# Patient Record
Sex: Female | Born: 1935 | Race: White | Hispanic: No
Health system: Southern US, Community
[De-identification: ages and names within clinical notes are randomized; demographics above are authoritative.]

---

## 2000-08-24 ENCOUNTER — Ambulatory Visit (HOSPITAL_COMMUNITY): Admission: RE | Admit: 2000-08-24 | Discharge: 2000-08-24 | Payer: Self-pay | Admitting: *Deleted

## 2000-08-24 ENCOUNTER — Encounter: Payer: Self-pay | Admitting: *Deleted

## 2001-01-19 ENCOUNTER — Other Ambulatory Visit: Admission: RE | Admit: 2001-01-19 | Discharge: 2001-01-19 | Payer: Self-pay | Admitting: Family Medicine

## 2001-02-07 ENCOUNTER — Encounter: Payer: Self-pay | Admitting: Family Medicine

## 2001-02-07 ENCOUNTER — Ambulatory Visit (HOSPITAL_COMMUNITY): Admission: RE | Admit: 2001-02-07 | Discharge: 2001-02-07 | Payer: Self-pay | Admitting: Family Medicine

## 2003-02-27 ENCOUNTER — Other Ambulatory Visit: Payer: Self-pay

## 2004-08-26 ENCOUNTER — Ambulatory Visit: Payer: Self-pay | Admitting: Family Medicine

## 2004-09-06 ENCOUNTER — Ambulatory Visit: Payer: Self-pay | Admitting: Family Medicine

## 2004-11-15 ENCOUNTER — Ambulatory Visit: Payer: Self-pay

## 2005-06-22 ENCOUNTER — Ambulatory Visit: Payer: Self-pay | Admitting: Family Medicine

## 2005-09-05 ENCOUNTER — Ambulatory Visit: Payer: Self-pay | Admitting: Specialist

## 2005-09-29 ENCOUNTER — Ambulatory Visit: Payer: Self-pay | Admitting: Family Medicine

## 2005-12-08 ENCOUNTER — Ambulatory Visit: Payer: Self-pay | Admitting: Gastroenterology

## 2006-09-07 ENCOUNTER — Ambulatory Visit: Payer: Self-pay | Admitting: Family Medicine

## 2007-09-25 ENCOUNTER — Ambulatory Visit: Payer: Self-pay | Admitting: Family Medicine

## 2008-09-18 ENCOUNTER — Ambulatory Visit: Payer: Self-pay | Admitting: Cardiology

## 2008-09-26 ENCOUNTER — Ambulatory Visit: Payer: Self-pay | Admitting: Cardiology

## 2009-10-12 ENCOUNTER — Ambulatory Visit: Payer: Self-pay | Admitting: Internal Medicine

## 2010-09-21 ENCOUNTER — Ambulatory Visit: Payer: Self-pay | Admitting: Internal Medicine

## 2011-11-30 ENCOUNTER — Ambulatory Visit: Payer: Self-pay | Admitting: Gastroenterology

## 2012-03-05 ENCOUNTER — Other Ambulatory Visit: Payer: Self-pay

## 2012-03-05 LAB — PRO B NATRIURETIC PEPTIDE: B-Type Natriuretic Peptide: 92629 pg/mL — ABNORMAL HIGH (ref 0–125)

## 2012-05-02 ENCOUNTER — Inpatient Hospital Stay: Payer: Self-pay

## 2012-05-02 LAB — BASIC METABOLIC PANEL
Anion Gap: 6 — ABNORMAL LOW (ref 7–16)
BUN: 23 mg/dL — ABNORMAL HIGH (ref 7–18)
Calcium, Total: 7.8 mg/dL — ABNORMAL LOW (ref 8.5–10.1)
Chloride: 106 mmol/L (ref 98–107)
Co2: 31 mmol/L (ref 21–32)
Creatinine: 1.67 mg/dL — ABNORMAL HIGH (ref 0.60–1.30)
EGFR (African American): 35 — ABNORMAL LOW
EGFR (Non-African Amer.): 30 — ABNORMAL LOW
Glucose: 95 mg/dL (ref 65–99)
Osmolality: 288 (ref 275–301)
Potassium: 4.8 mmol/L (ref 3.5–5.1)
Sodium: 143 mmol/L (ref 136–145)

## 2012-05-02 LAB — CBC
HCT: 34.6 % — ABNORMAL LOW (ref 35.0–47.0)
HGB: 11.3 g/dL — ABNORMAL LOW (ref 12.0–16.0)
MCH: 33 pg (ref 26.0–34.0)
MCHC: 32.6 g/dL (ref 32.0–36.0)
MCV: 101 fL — ABNORMAL HIGH (ref 80–100)
Platelet: 301 10*3/uL (ref 150–440)
RBC: 3.41 10*6/uL — ABNORMAL LOW (ref 3.80–5.20)
RDW: 16 % — ABNORMAL HIGH (ref 11.5–14.5)
WBC: 11.1 10*3/uL — ABNORMAL HIGH (ref 3.6–11.0)

## 2012-05-02 LAB — PRO B NATRIURETIC PEPTIDE: B-Type Natriuretic Peptide: 45431 pg/mL — ABNORMAL HIGH (ref 0–125)

## 2012-05-02 LAB — CK TOTAL AND CKMB (NOT AT ARMC)
CK, Total: 78 U/L (ref 21–215)
CK-MB: 2 ng/mL (ref 0.5–3.6)

## 2012-05-02 LAB — TROPONIN I: Troponin-I: 0.04 ng/mL

## 2012-05-03 LAB — BASIC METABOLIC PANEL
Anion Gap: 8 (ref 7–16)
BUN: 26 mg/dL — ABNORMAL HIGH (ref 7–18)
Calcium, Total: 7.8 mg/dL — ABNORMAL LOW (ref 8.5–10.1)
Chloride: 107 mmol/L (ref 98–107)
Co2: 29 mmol/L (ref 21–32)
Creatinine: 1.65 mg/dL — ABNORMAL HIGH (ref 0.60–1.30)
EGFR (African American): 35 — ABNORMAL LOW
EGFR (Non-African Amer.): 30 — ABNORMAL LOW
Glucose: 127 mg/dL — ABNORMAL HIGH (ref 65–99)
Osmolality: 293 (ref 275–301)
Potassium: 4.7 mmol/L (ref 3.5–5.1)
Sodium: 144 mmol/L (ref 136–145)

## 2012-05-03 LAB — URINALYSIS, COMPLETE
Glucose,UR: NEGATIVE mg/dL (ref 0–75)
Hyaline Cast: 38
Ketone: NEGATIVE
Nitrite: NEGATIVE
Ph: 5 (ref 4.5–8.0)
Protein: 30
RBC,UR: 25 /HPF (ref 0–5)
Specific Gravity: 1.011 (ref 1.003–1.030)
Squamous Epithelial: 1

## 2012-05-03 LAB — CBC WITH DIFFERENTIAL/PLATELET
Basophil #: 0 10*3/uL (ref 0.0–0.1)
Basophil %: 0.4 %
Eosinophil #: 0 10*3/uL (ref 0.0–0.7)
Eosinophil %: 0.1 %
HCT: 33.5 % — ABNORMAL LOW (ref 35.0–47.0)
HGB: 11 g/dL — ABNORMAL LOW (ref 12.0–16.0)
Lymphocyte #: 0.3 10*3/uL — ABNORMAL LOW (ref 1.0–3.6)
Lymphocyte %: 3.3 %
MCH: 33.4 pg (ref 26.0–34.0)
MCHC: 33 g/dL (ref 32.0–36.0)
MCV: 101 fL — ABNORMAL HIGH (ref 80–100)
Monocyte #: 0.1 x10 3/mm — ABNORMAL LOW (ref 0.2–0.9)
Monocyte %: 0.5 %
Neutrophil #: 9.4 10*3/uL — ABNORMAL HIGH (ref 1.4–6.5)
Neutrophil %: 95.7 %
Platelet: 261 10*3/uL (ref 150–440)
RBC: 3.3 10*6/uL — ABNORMAL LOW (ref 3.80–5.20)
RDW: 15.6 % — ABNORMAL HIGH (ref 11.5–14.5)
WBC: 9.8 10*3/uL (ref 3.6–11.0)

## 2012-05-03 LAB — CK TOTAL AND CKMB (NOT AT ARMC)
CK, Total: 57 U/L (ref 21–215)
CK, Total: 46 U/L (ref 21–215)
CK-MB: 1.8 ng/mL (ref 0.5–3.6)
CK-MB: 1.9 ng/mL (ref 0.5–3.6)

## 2012-05-03 LAB — TROPONIN I
Troponin-I: 0.04 ng/mL
Troponin-I: 0.03 ng/mL

## 2012-05-04 LAB — BASIC METABOLIC PANEL WITH GFR
Anion Gap: 5 — ABNORMAL LOW
BUN: 35 mg/dL — ABNORMAL HIGH
Calcium, Total: 7.9 mg/dL — ABNORMAL LOW
Chloride: 105 mmol/L
Co2: 29 mmol/L
Creatinine: 1.9 mg/dL — ABNORMAL HIGH
EGFR (African American): 30 — ABNORMAL LOW
EGFR (Non-African Amer.): 26 — ABNORMAL LOW
Glucose: 95 mg/dL
Osmolality: 285
Potassium: 4.7 mmol/L
Sodium: 139 mmol/L

## 2012-05-05 LAB — BASIC METABOLIC PANEL
Anion Gap: 7 (ref 7–16)
BUN: 40 mg/dL — ABNORMAL HIGH (ref 7–18)
Chloride: 105 mmol/L (ref 98–107)
Co2: 28 mmol/L (ref 21–32)
Creatinine: 1.72 mg/dL — ABNORMAL HIGH (ref 0.60–1.30)
EGFR (African American): 33 — ABNORMAL LOW
Glucose: 108 mg/dL — ABNORMAL HIGH (ref 65–99)
Osmolality: 290 (ref 275–301)
Potassium: 5.1 mmol/L (ref 3.5–5.1)

## 2012-05-05 LAB — URINE CULTURE

## 2012-05-06 LAB — BASIC METABOLIC PANEL
BUN: 52 mg/dL — ABNORMAL HIGH (ref 7–18)
Calcium, Total: 7.5 mg/dL — ABNORMAL LOW (ref 8.5–10.1)
Chloride: 103 mmol/L (ref 98–107)
Co2: 29 mmol/L (ref 21–32)
EGFR (African American): 28 — ABNORMAL LOW
EGFR (Non-African Amer.): 25 — ABNORMAL LOW
Osmolality: 290 (ref 275–301)
Potassium: 4.8 mmol/L (ref 3.5–5.1)

## 2012-05-06 LAB — CBC WITH DIFFERENTIAL/PLATELET
Basophil #: 0 10*3/uL (ref 0.0–0.1)
Basophil %: 0.2 %
Eosinophil #: 0 10*3/uL (ref 0.0–0.7)
HGB: 11.2 g/dL — ABNORMAL LOW (ref 12.0–16.0)
Lymphocyte #: 0.2 10*3/uL — ABNORMAL LOW (ref 1.0–3.6)
Lymphocyte %: 2.9 %
MCHC: 32.5 g/dL (ref 32.0–36.0)
Monocyte %: 1.3 %
Neutrophil %: 95.6 %
Platelet: 377 10*3/uL (ref 150–440)
RBC: 3.42 10*6/uL — ABNORMAL LOW (ref 3.80–5.20)
RDW: 15.4 % — ABNORMAL HIGH (ref 11.5–14.5)
WBC: 8.3 10*3/uL (ref 3.6–11.0)

## 2012-05-07 LAB — BASIC METABOLIC PANEL
BUN: 62 mg/dL — ABNORMAL HIGH (ref 7–18)
Co2: 27 mmol/L (ref 21–32)
EGFR (African American): 27 — ABNORMAL LOW
EGFR (Non-African Amer.): 23 — ABNORMAL LOW
Glucose: 190 mg/dL — ABNORMAL HIGH (ref 65–99)
Osmolality: 293 (ref 275–301)
Sodium: 135 mmol/L — ABNORMAL LOW (ref 136–145)

## 2012-05-08 LAB — BASIC METABOLIC PANEL
BUN: 57 mg/dL — ABNORMAL HIGH (ref 7–18)
Creatinine: 1.87 mg/dL — ABNORMAL HIGH (ref 0.60–1.30)
EGFR (Non-African Amer.): 26 — ABNORMAL LOW
Glucose: 134 mg/dL — ABNORMAL HIGH (ref 65–99)
Osmolality: 293 (ref 275–301)
Potassium: 4.9 mmol/L (ref 3.5–5.1)
Sodium: 138 mmol/L (ref 136–145)

## 2012-05-09 LAB — BASIC METABOLIC PANEL
Anion Gap: 6 — ABNORMAL LOW (ref 7–16)
BUN: 57 mg/dL — ABNORMAL HIGH (ref 7–18)
Calcium, Total: 7.7 mg/dL — ABNORMAL LOW (ref 8.5–10.1)
Chloride: 102 mmol/L (ref 98–107)
Co2: 29 mmol/L (ref 21–32)
Creatinine: 1.68 mg/dL — ABNORMAL HIGH (ref 0.60–1.30)
EGFR (African American): 34 — ABNORMAL LOW
EGFR (Non-African Amer.): 30 — ABNORMAL LOW
Glucose: 134 mg/dL — ABNORMAL HIGH (ref 65–99)

## 2012-05-10 LAB — BASIC METABOLIC PANEL
Anion Gap: 7 (ref 7–16)
BUN: 56 mg/dL — ABNORMAL HIGH (ref 7–18)
Calcium, Total: 7.6 mg/dL — ABNORMAL LOW (ref 8.5–10.1)
Chloride: 102 mmol/L (ref 98–107)
Co2: 29 mmol/L (ref 21–32)
EGFR (African American): 32 — ABNORMAL LOW
EGFR (Non-African Amer.): 28 — ABNORMAL LOW
Osmolality: 294 (ref 275–301)
Potassium: 4.4 mmol/L (ref 3.5–5.1)

## 2012-05-10 LAB — PRO B NATRIURETIC PEPTIDE: B-Type Natriuretic Peptide: 64070 pg/mL — ABNORMAL HIGH (ref 0–125)

## 2012-05-11 LAB — BASIC METABOLIC PANEL
Calcium, Total: 7.6 mg/dL — ABNORMAL LOW (ref 8.5–10.1)
Chloride: 102 mmol/L (ref 98–107)
Co2: 31 mmol/L (ref 21–32)
Creatinine: 1.63 mg/dL — ABNORMAL HIGH (ref 0.60–1.30)
EGFR (African American): 36 — ABNORMAL LOW
EGFR (Non-African Amer.): 31 — ABNORMAL LOW
Glucose: 133 mg/dL — ABNORMAL HIGH (ref 65–99)
Osmolality: 294 (ref 275–301)
Potassium: 4.5 mmol/L (ref 3.5–5.1)
Sodium: 139 mmol/L (ref 136–145)

## 2012-05-11 LAB — PLATELET COUNT: Platelet: 345 10*3/uL (ref 150–440)

## 2012-05-12 LAB — CBC WITH DIFFERENTIAL/PLATELET
Basophil #: 0 10*3/uL (ref 0.0–0.1)
Basophil %: 0.2 %
Eosinophil #: 0 10*3/uL (ref 0.0–0.7)
Eosinophil %: 0 %
HCT: 35.7 % (ref 35.0–47.0)
Lymphocyte #: 0.1 10*3/uL — ABNORMAL LOW (ref 1.0–3.6)
Lymphocyte %: 1.1 %
MCH: 33.6 pg (ref 26.0–34.0)
MCHC: 33.7 g/dL (ref 32.0–36.0)
MCV: 100 fL (ref 80–100)
Neutrophil %: 97.6 %
RBC: 3.58 10*6/uL — ABNORMAL LOW (ref 3.80–5.20)

## 2012-05-12 LAB — BASIC METABOLIC PANEL
Anion Gap: 7 (ref 7–16)
BUN: 48 mg/dL — ABNORMAL HIGH (ref 7–18)
Chloride: 101 mmol/L (ref 98–107)
Creatinine: 1.59 mg/dL — ABNORMAL HIGH (ref 0.60–1.30)
EGFR (African American): 37 — ABNORMAL LOW
Osmolality: 294 (ref 275–301)
Potassium: 4.9 mmol/L (ref 3.5–5.1)
Sodium: 139 mmol/L (ref 136–145)

## 2012-05-13 LAB — BASIC METABOLIC PANEL
Calcium, Total: 7.7 mg/dL — ABNORMAL LOW (ref 8.5–10.1)
Chloride: 103 mmol/L (ref 98–107)
Co2: 31 mmol/L (ref 21–32)
Creatinine: 1.4 mg/dL — ABNORMAL HIGH (ref 0.60–1.30)
EGFR (African American): 43 — ABNORMAL LOW
Glucose: 96 mg/dL (ref 65–99)
Osmolality: 290 (ref 275–301)
Potassium: 4.5 mmol/L (ref 3.5–5.1)
Sodium: 140 mmol/L (ref 136–145)

## 2012-05-14 LAB — BASIC METABOLIC PANEL
Anion Gap: 4 — ABNORMAL LOW (ref 7–16)
Calcium, Total: 7.8 mg/dL — ABNORMAL LOW (ref 8.5–10.1)
Co2: 33 mmol/L — ABNORMAL HIGH (ref 21–32)
Creatinine: 1.35 mg/dL — ABNORMAL HIGH (ref 0.60–1.30)
EGFR (African American): 45 — ABNORMAL LOW
EGFR (Non-African Amer.): 39 — ABNORMAL LOW
Glucose: 98 mg/dL (ref 65–99)
Osmolality: 285 (ref 275–301)
Potassium: 4.5 mmol/L (ref 3.5–5.1)

## 2012-05-15 LAB — BASIC METABOLIC PANEL
Anion Gap: 4 — ABNORMAL LOW (ref 7–16)
Calcium, Total: 7.8 mg/dL — ABNORMAL LOW (ref 8.5–10.1)
Co2: 32 mmol/L (ref 21–32)
EGFR (African American): 43 — ABNORMAL LOW
EGFR (Non-African Amer.): 37 — ABNORMAL LOW
Glucose: 127 mg/dL — ABNORMAL HIGH (ref 65–99)
Sodium: 138 mmol/L (ref 136–145)

## 2012-05-16 ENCOUNTER — Encounter: Payer: Self-pay | Admitting: Internal Medicine

## 2012-05-22 LAB — BASIC METABOLIC PANEL
Creatinine: 1.36 mg/dL — ABNORMAL HIGH (ref 0.60–1.30)
EGFR (African American): 44 — ABNORMAL LOW
Glucose: 78 mg/dL (ref 65–99)
Osmolality: 281 (ref 275–301)

## 2012-05-26 ENCOUNTER — Encounter: Payer: Self-pay | Admitting: Internal Medicine

## 2012-08-10 ENCOUNTER — Inpatient Hospital Stay: Payer: Self-pay

## 2012-08-10 LAB — CBC
HGB: 11.7 g/dL — ABNORMAL LOW (ref 12.0–16.0)
MCHC: 32.4 g/dL (ref 32.0–36.0)
RBC: 3.63 10*6/uL — ABNORMAL LOW (ref 3.80–5.20)
RDW: 14.3 % (ref 11.5–14.5)
WBC: 8.5 10*3/uL (ref 3.6–11.0)

## 2012-08-10 LAB — BASIC METABOLIC PANEL
BUN: 48 mg/dL — ABNORMAL HIGH (ref 7–18)
Calcium, Total: 8.5 mg/dL (ref 8.5–10.1)
Chloride: 100 mmol/L (ref 98–107)
Co2: 27 mmol/L (ref 21–32)
EGFR (Non-African Amer.): 17 — ABNORMAL LOW
Glucose: 122 mg/dL — ABNORMAL HIGH (ref 65–99)
Osmolality: 286 (ref 275–301)
Potassium: 5.1 mmol/L (ref 3.5–5.1)

## 2012-08-10 LAB — CK TOTAL AND CKMB (NOT AT ARMC): CK-MB: 1.9 ng/mL (ref 0.5–3.6)

## 2012-08-10 LAB — PRO B NATRIURETIC PEPTIDE: B-Type Natriuretic Peptide: 63358 pg/mL — ABNORMAL HIGH (ref 0–125)

## 2012-08-11 LAB — BASIC METABOLIC PANEL
BUN: 50 mg/dL — ABNORMAL HIGH (ref 7–18)
Calcium, Total: 8.5 mg/dL (ref 8.5–10.1)
Co2: 27 mmol/L (ref 21–32)
Creatinine: 2.79 mg/dL — ABNORMAL HIGH (ref 0.60–1.30)
EGFR (African American): 19 — ABNORMAL LOW
Osmolality: 289 (ref 275–301)
Potassium: 5 mmol/L (ref 3.5–5.1)
Sodium: 137 mmol/L (ref 136–145)

## 2012-08-11 LAB — CBC WITH DIFFERENTIAL/PLATELET
Basophil #: 0.1 10*3/uL (ref 0.0–0.1)
Basophil %: 0.6 %
Lymphocyte #: 1.2 10*3/uL (ref 1.0–3.6)
MCH: 32.5 pg (ref 26.0–34.0)
MCHC: 33.1 g/dL (ref 32.0–36.0)
Monocyte %: 10.5 %
Neutrophil #: 6.2 10*3/uL (ref 1.4–6.5)
RDW: 14.2 % (ref 11.5–14.5)
WBC: 8.3 10*3/uL (ref 3.6–11.0)

## 2012-08-11 LAB — CK TOTAL AND CKMB (NOT AT ARMC)
CK, Total: 60 U/L (ref 21–215)
CK-MB: 2.2 ng/mL (ref 0.5–3.6)
CK-MB: 2.1 ng/mL (ref 0.5–3.6)

## 2012-08-11 LAB — TROPONIN I: Troponin-I: 0.1 ng/mL — ABNORMAL HIGH

## 2012-08-12 LAB — BASIC METABOLIC PANEL
BUN: 59 mg/dL — ABNORMAL HIGH (ref 7–18)
Chloride: 101 mmol/L (ref 98–107)
Co2: 30 mmol/L (ref 21–32)
EGFR (African American): 24 — ABNORMAL LOW
EGFR (Non-African Amer.): 21 — ABNORMAL LOW
Osmolality: 291 (ref 275–301)
Sodium: 136 mmol/L (ref 136–145)

## 2012-08-12 LAB — CBC WITH DIFFERENTIAL/PLATELET
Basophil #: 0 10*3/uL (ref 0.0–0.1)
Basophil %: 0.1 %
Eosinophil #: 0 10*3/uL (ref 0.0–0.7)
HCT: 30.6 % — ABNORMAL LOW (ref 35.0–47.0)
HGB: 10 g/dL — ABNORMAL LOW (ref 12.0–16.0)
Lymphocyte %: 4.5 %
MCHC: 32.8 g/dL (ref 32.0–36.0)
Monocyte #: 0.1 x10 3/mm — ABNORMAL LOW (ref 0.2–0.9)
Monocyte %: 1.5 %
Neutrophil #: 5.6 10*3/uL (ref 1.4–6.5)
Neutrophil %: 93.9 %
RBC: 3.11 10*6/uL — ABNORMAL LOW (ref 3.80–5.20)
RDW: 13.7 % (ref 11.5–14.5)
WBC: 6 10*3/uL (ref 3.6–11.0)

## 2012-08-13 LAB — BASIC METABOLIC PANEL
Anion Gap: 7 (ref 7–16)
Calcium, Total: 8.1 mg/dL — ABNORMAL LOW (ref 8.5–10.1)
Chloride: 100 mmol/L (ref 98–107)
Co2: 26 mmol/L (ref 21–32)
EGFR (African American): 21 — ABNORMAL LOW
EGFR (Non-African Amer.): 18 — ABNORMAL LOW
Glucose: 130 mg/dL — ABNORMAL HIGH (ref 65–99)
Glucose: 124 mg/dL — ABNORMAL HIGH (ref 65–99)
Osmolality: 284 (ref 275–301)
Osmolality: 292 (ref 275–301)
Potassium: 5.5 mmol/L — ABNORMAL HIGH (ref 3.5–5.1)
Potassium: 4.5 mmol/L (ref 3.5–5.1)
Sodium: 135 mmol/L — ABNORMAL LOW (ref 136–145)

## 2012-08-13 LAB — CBC WITH DIFFERENTIAL/PLATELET
Basophil %: 0.1 %
Eosinophil #: 0 10*3/uL (ref 0.0–0.7)
HGB: 11.2 g/dL — ABNORMAL LOW (ref 12.0–16.0)
Lymphocyte #: 0.3 10*3/uL — ABNORMAL LOW (ref 1.0–3.6)
MCH: 33 pg (ref 26.0–34.0)
MCHC: 33.6 g/dL (ref 32.0–36.0)
MCV: 98 fL (ref 80–100)
Monocyte #: 0.1 x10 3/mm — ABNORMAL LOW (ref 0.2–0.9)
Neutrophil #: 6 10*3/uL (ref 1.4–6.5)
Platelet: 152 10*3/uL (ref 150–440)
RBC: 3.38 10*6/uL — ABNORMAL LOW (ref 3.80–5.20)
WBC: 6.5 10*3/uL (ref 3.6–11.0)

## 2012-08-14 LAB — BASIC METABOLIC PANEL
Anion Gap: 7 (ref 7–16)
BUN: 77 mg/dL — ABNORMAL HIGH (ref 7–18)
Calcium, Total: 7.9 mg/dL — ABNORMAL LOW (ref 8.5–10.1)
Chloride: 101 mmol/L (ref 98–107)
EGFR (African American): 20 — ABNORMAL LOW
Osmolality: 298 (ref 275–301)
Potassium: 5 mmol/L (ref 3.5–5.1)
Sodium: 136 mmol/L (ref 136–145)

## 2012-08-15 LAB — BASIC METABOLIC PANEL
Anion Gap: 5 — ABNORMAL LOW (ref 7–16)
BUN: 82 mg/dL — ABNORMAL HIGH (ref 7–18)
Calcium, Total: 8 mg/dL — ABNORMAL LOW (ref 8.5–10.1)
Chloride: 102 mmol/L (ref 98–107)
Co2: 31 mmol/L (ref 21–32)
EGFR (African American): 24 — ABNORMAL LOW
Glucose: 114 mg/dL — ABNORMAL HIGH (ref 65–99)
Sodium: 138 mmol/L (ref 136–145)

## 2012-08-16 LAB — BASIC METABOLIC PANEL
Anion Gap: 5 — ABNORMAL LOW (ref 7–16)
BUN: 77 mg/dL — ABNORMAL HIGH (ref 7–18)
Chloride: 104 mmol/L (ref 98–107)
Co2: 31 mmol/L (ref 21–32)
Creatinine: 1.86 mg/dL — ABNORMAL HIGH (ref 0.60–1.30)
EGFR (African American): 30 — ABNORMAL LOW
EGFR (Non-African Amer.): 26 — ABNORMAL LOW
Glucose: 107 mg/dL — ABNORMAL HIGH (ref 65–99)
Potassium: 4.3 mmol/L (ref 3.5–5.1)

## 2012-08-16 LAB — CBC WITH DIFFERENTIAL/PLATELET
Basophil #: 0 10*3/uL (ref 0.0–0.1)
Eosinophil #: 0 10*3/uL (ref 0.0–0.7)
HCT: 36.2 % (ref 35.0–47.0)
HGB: 11.9 g/dL — ABNORMAL LOW (ref 12.0–16.0)
Lymphocyte #: 0.5 10*3/uL — ABNORMAL LOW (ref 1.0–3.6)
MCH: 32.2 pg (ref 26.0–34.0)
MCHC: 33 g/dL (ref 32.0–36.0)
Monocyte #: 0.4 x10 3/mm (ref 0.2–0.9)
Monocyte %: 5.1 %
Neutrophil #: 6.7 10*3/uL — ABNORMAL HIGH (ref 1.4–6.5)
Platelet: 189 10*3/uL (ref 150–440)
RBC: 3.71 10*6/uL — ABNORMAL LOW (ref 3.80–5.20)

## 2012-08-16 LAB — CULTURE, BLOOD (SINGLE)

## 2012-08-17 LAB — BASIC METABOLIC PANEL
Anion Gap: 6 — ABNORMAL LOW (ref 7–16)
BUN: 69 mg/dL — ABNORMAL HIGH (ref 7–18)
Calcium, Total: 8.3 mg/dL — ABNORMAL LOW (ref 8.5–10.1)
Co2: 33 mmol/L — ABNORMAL HIGH (ref 21–32)
EGFR (African American): 32 — ABNORMAL LOW
Osmolality: 301 (ref 275–301)
Potassium: 4.1 mmol/L (ref 3.5–5.1)
Sodium: 141 mmol/L (ref 136–145)

## 2012-08-18 LAB — BASIC METABOLIC PANEL
BUN: 62 mg/dL — ABNORMAL HIGH (ref 7–18)
Calcium, Total: 8.3 mg/dL — ABNORMAL LOW (ref 8.5–10.1)
Chloride: 102 mmol/L (ref 98–107)
Co2: 34 mmol/L — ABNORMAL HIGH (ref 21–32)
Creatinine: 1.78 mg/dL — ABNORMAL HIGH (ref 0.60–1.30)
EGFR (African American): 32 — ABNORMAL LOW
EGFR (Non-African Amer.): 28 — ABNORMAL LOW
Glucose: 83 mg/dL (ref 65–99)
Osmolality: 296 (ref 275–301)
Potassium: 4.1 mmol/L (ref 3.5–5.1)

## 2012-08-19 LAB — BASIC METABOLIC PANEL
Calcium, Total: 8.4 mg/dL — ABNORMAL LOW (ref 8.5–10.1)
Co2: 35 mmol/L — ABNORMAL HIGH (ref 21–32)
EGFR (African American): 33 — ABNORMAL LOW
Glucose: 97 mg/dL (ref 65–99)

## 2012-08-29 ENCOUNTER — Emergency Department: Payer: Self-pay | Admitting: Unknown Physician Specialty

## 2012-09-15 LAB — CULTURE, BLOOD (SINGLE)

## 2013-06-05 ENCOUNTER — Inpatient Hospital Stay: Payer: Self-pay

## 2013-06-05 LAB — CBC WITH DIFFERENTIAL/PLATELET
Basophil #: 0.1 10*3/uL (ref 0.0–0.1)
Basophil %: 1.4 %
EOS ABS: 0.3 10*3/uL (ref 0.0–0.7)
Eosinophil %: 7 %
HCT: 38.5 % (ref 35.0–47.0)
HGB: 12.3 g/dL (ref 12.0–16.0)
LYMPHS ABS: 1.1 10*3/uL (ref 1.0–3.6)
LYMPHS PCT: 23.5 %
MCH: 30.7 pg (ref 26.0–34.0)
MCHC: 32 g/dL (ref 32.0–36.0)
MCV: 96 fL (ref 80–100)
MONOS PCT: 7.9 %
Monocyte #: 0.4 x10 3/mm (ref 0.2–0.9)
Neutrophil #: 2.8 10*3/uL (ref 1.4–6.5)
Neutrophil %: 60.2 %
PLATELETS: 154 10*3/uL (ref 150–440)
RBC: 4.02 10*6/uL (ref 3.80–5.20)
RDW: 15.6 % — AB (ref 11.5–14.5)
WBC: 4.7 10*3/uL (ref 3.6–11.0)

## 2013-06-05 LAB — TROPONIN I
Troponin-I: 0.06 ng/mL — ABNORMAL HIGH
Troponin-I: 0.06 ng/mL — ABNORMAL HIGH
Troponin-I: 0.06 ng/mL — ABNORMAL HIGH

## 2013-06-05 LAB — COMPREHENSIVE METABOLIC PANEL
ALBUMIN: 3.3 g/dL — AB (ref 3.4–5.0)
Alkaline Phosphatase: 84 U/L
Anion Gap: 5 — ABNORMAL LOW (ref 7–16)
BUN: 27 mg/dL — AB (ref 7–18)
Bilirubin,Total: 0.4 mg/dL (ref 0.2–1.0)
CO2: 31 mmol/L (ref 21–32)
Calcium, Total: 9.1 mg/dL (ref 8.5–10.1)
Chloride: 103 mmol/L (ref 98–107)
Creatinine: 1.5 mg/dL — ABNORMAL HIGH (ref 0.60–1.30)
EGFR (Non-African Amer.): 34 — ABNORMAL LOW
GFR CALC AF AMER: 39 — AB
GLUCOSE: 92 mg/dL (ref 65–99)
Osmolality: 282 (ref 275–301)
Potassium: 4.2 mmol/L (ref 3.5–5.1)
SGOT(AST): 23 U/L (ref 15–37)
SGPT (ALT): 25 U/L (ref 12–78)
SODIUM: 139 mmol/L (ref 136–145)
TOTAL PROTEIN: 7.4 g/dL (ref 6.4–8.2)

## 2013-06-05 LAB — CK-MB
CK-MB: 4 ng/mL — ABNORMAL HIGH (ref 0.5–3.6)
CK-MB: 3.1 ng/mL (ref 0.5–3.6)

## 2013-06-05 LAB — CK TOTAL AND CKMB (NOT AT ARMC)
CK, Total: 119 U/L
CK-MB: 3.8 ng/mL — ABNORMAL HIGH (ref 0.5–3.6)

## 2013-06-05 LAB — PRO B NATRIURETIC PEPTIDE: B-Type Natriuretic Peptide: 14103 pg/mL — ABNORMAL HIGH (ref 0–450)

## 2013-06-05 LAB — CK
CK, TOTAL: 109 U/L
CK, Total: 104 U/L

## 2013-06-05 LAB — TSH: Thyroid Stimulating Horm: 3.92 u[IU]/mL

## 2013-06-06 LAB — LIPID PANEL
Cholesterol: 82 mg/dL (ref 0–200)
HDL: 42 mg/dL (ref 40–60)
Ldl Cholesterol, Calc: 28 mg/dL (ref 0–100)
Triglycerides: 61 mg/dL (ref 0–200)
VLDL Cholesterol, Calc: 12 mg/dL (ref 5–40)

## 2013-06-06 LAB — CBC WITH DIFFERENTIAL/PLATELET
BASOS PCT: 1.8 %
Basophil #: 0.1 10*3/uL (ref 0.0–0.1)
EOS PCT: 6.8 %
Eosinophil #: 0.4 10*3/uL (ref 0.0–0.7)
HCT: 35.4 % (ref 35.0–47.0)
HGB: 11.8 g/dL — AB (ref 12.0–16.0)
LYMPHS ABS: 1.2 10*3/uL (ref 1.0–3.6)
Lymphocyte %: 22.6 %
MCH: 31.8 pg (ref 26.0–34.0)
MCHC: 33.4 g/dL (ref 32.0–36.0)
MCV: 95 fL (ref 80–100)
MONO ABS: 0.4 x10 3/mm (ref 0.2–0.9)
Monocyte %: 8.5 %
NEUTROS ABS: 3.1 10*3/uL (ref 1.4–6.5)
Neutrophil %: 60.3 %
PLATELETS: 146 10*3/uL — AB (ref 150–440)
RBC: 3.72 10*6/uL — ABNORMAL LOW (ref 3.80–5.20)
RDW: 15.6 % — AB (ref 11.5–14.5)
WBC: 5.2 10*3/uL (ref 3.6–11.0)

## 2013-06-06 LAB — BASIC METABOLIC PANEL
ANION GAP: 4 — AB (ref 7–16)
BUN: 30 mg/dL — ABNORMAL HIGH (ref 7–18)
CHLORIDE: 101 mmol/L (ref 98–107)
Calcium, Total: 8.7 mg/dL (ref 8.5–10.1)
Co2: 33 mmol/L — ABNORMAL HIGH (ref 21–32)
Creatinine: 1.66 mg/dL — ABNORMAL HIGH (ref 0.60–1.30)
EGFR (African American): 35 — ABNORMAL LOW
GFR CALC NON AF AMER: 30 — AB
Glucose: 88 mg/dL (ref 65–99)
Osmolality: 281 (ref 275–301)
Potassium: 4.2 mmol/L (ref 3.5–5.1)
Sodium: 138 mmol/L (ref 136–145)

## 2013-06-07 LAB — BASIC METABOLIC PANEL
Anion Gap: 4 — ABNORMAL LOW (ref 7–16)
BUN: 44 mg/dL — ABNORMAL HIGH (ref 7–18)
CO2: 30 mmol/L (ref 21–32)
Calcium, Total: 8.4 mg/dL — ABNORMAL LOW (ref 8.5–10.1)
Chloride: 98 mmol/L (ref 98–107)
Creatinine: 1.99 mg/dL — ABNORMAL HIGH (ref 0.60–1.30)
EGFR (Non-African Amer.): 24 — ABNORMAL LOW
GFR CALC AF AMER: 28 — AB
GLUCOSE: 189 mg/dL — AB (ref 65–99)
Osmolality: 281 (ref 275–301)
Potassium: 4.3 mmol/L (ref 3.5–5.1)
Sodium: 132 mmol/L — ABNORMAL LOW (ref 136–145)

## 2013-06-08 LAB — CBC WITH DIFFERENTIAL/PLATELET
Basophil #: 0 10*3/uL (ref 0.0–0.1)
Basophil %: 0.1 %
EOS PCT: 0 %
Eosinophil #: 0 10*3/uL (ref 0.0–0.7)
HCT: 33.2 % — ABNORMAL LOW (ref 35.0–47.0)
HGB: 10.8 g/dL — ABNORMAL LOW (ref 12.0–16.0)
Lymphocyte #: 0.5 10*3/uL — ABNORMAL LOW (ref 1.0–3.6)
Lymphocyte %: 7.6 %
MCH: 30.6 pg (ref 26.0–34.0)
MCHC: 32.5 g/dL (ref 32.0–36.0)
MCV: 94 fL (ref 80–100)
Monocyte #: 0.4 x10 3/mm (ref 0.2–0.9)
Monocyte %: 6.5 %
NEUTROS ABS: 5.8 10*3/uL (ref 1.4–6.5)
NEUTROS PCT: 85.8 %
Platelet: 159 10*3/uL (ref 150–440)
RBC: 3.53 10*6/uL — AB (ref 3.80–5.20)
RDW: 15.5 % — ABNORMAL HIGH (ref 11.5–14.5)
WBC: 6.8 10*3/uL (ref 3.6–11.0)

## 2013-06-08 LAB — BASIC METABOLIC PANEL
Anion Gap: 5 — ABNORMAL LOW (ref 7–16)
BUN: 62 mg/dL — ABNORMAL HIGH (ref 7–18)
CALCIUM: 8.5 mg/dL (ref 8.5–10.1)
Chloride: 95 mmol/L — ABNORMAL LOW (ref 98–107)
Co2: 31 mmol/L (ref 21–32)
Creatinine: 2.18 mg/dL — ABNORMAL HIGH (ref 0.60–1.30)
EGFR (African American): 25 — ABNORMAL LOW
EGFR (Non-African Amer.): 21 — ABNORMAL LOW
Glucose: 132 mg/dL — ABNORMAL HIGH (ref 65–99)
Osmolality: 282 (ref 275–301)
POTASSIUM: 4.2 mmol/L (ref 3.5–5.1)
Sodium: 131 mmol/L — ABNORMAL LOW (ref 136–145)

## 2013-06-09 LAB — BASIC METABOLIC PANEL
Anion Gap: 6 — ABNORMAL LOW (ref 7–16)
BUN: 70 mg/dL — ABNORMAL HIGH (ref 7–18)
CHLORIDE: 96 mmol/L — AB (ref 98–107)
CO2: 32 mmol/L (ref 21–32)
Calcium, Total: 7.9 mg/dL — ABNORMAL LOW (ref 8.5–10.1)
Creatinine: 2.14 mg/dL — ABNORMAL HIGH (ref 0.60–1.30)
EGFR (African American): 25 — ABNORMAL LOW
GFR CALC NON AF AMER: 22 — AB
Glucose: 101 mg/dL — ABNORMAL HIGH (ref 65–99)
Osmolality: 289 (ref 275–301)
Potassium: 4.4 mmol/L (ref 3.5–5.1)
SODIUM: 134 mmol/L — AB (ref 136–145)

## 2013-06-10 LAB — BASIC METABOLIC PANEL
ANION GAP: 5 — AB (ref 7–16)
BUN: 71 mg/dL — AB (ref 7–18)
Calcium, Total: 8.2 mg/dL — ABNORMAL LOW (ref 8.5–10.1)
Chloride: 98 mmol/L (ref 98–107)
Co2: 32 mmol/L (ref 21–32)
Creatinine: 1.97 mg/dL — ABNORMAL HIGH (ref 0.60–1.30)
EGFR (African American): 28 — ABNORMAL LOW
GFR CALC NON AF AMER: 24 — AB
Glucose: 104 mg/dL — ABNORMAL HIGH (ref 65–99)
Osmolality: 291 (ref 275–301)
POTASSIUM: 4.1 mmol/L (ref 3.5–5.1)
SODIUM: 135 mmol/L — AB (ref 136–145)

## 2013-07-26 ENCOUNTER — Inpatient Hospital Stay: Payer: Self-pay | Admitting: Internal Medicine

## 2013-07-26 LAB — BASIC METABOLIC PANEL
Anion Gap: 7 (ref 7–16)
BUN: 46 mg/dL — ABNORMAL HIGH (ref 7–18)
Calcium, Total: 8.7 mg/dL (ref 8.5–10.1)
Chloride: 101 mmol/L (ref 98–107)
Co2: 30 mmol/L (ref 21–32)
Creatinine: 2.08 mg/dL — ABNORMAL HIGH (ref 0.60–1.30)
EGFR (Non-African Amer.): 23 — ABNORMAL LOW
GFR CALC AF AMER: 26 — AB
Glucose: 120 mg/dL — ABNORMAL HIGH (ref 65–99)
Osmolality: 289 (ref 275–301)
Potassium: 4.3 mmol/L (ref 3.5–5.1)
Sodium: 138 mmol/L (ref 136–145)

## 2013-07-26 LAB — CK
CK, TOTAL: 77 U/L
CK, Total: 103 U/L

## 2013-07-26 LAB — CK-MB
CK-MB: 3.1 ng/mL (ref 0.5–3.6)
CK-MB: 2.9 ng/mL (ref 0.5–3.6)

## 2013-07-26 LAB — CBC
HCT: 36.1 % (ref 35.0–47.0)
HGB: 11.5 g/dL — AB (ref 12.0–16.0)
MCH: 30.1 pg (ref 26.0–34.0)
MCHC: 31.9 g/dL — AB (ref 32.0–36.0)
MCV: 94 fL (ref 80–100)
Platelet: 132 10*3/uL — ABNORMAL LOW (ref 150–440)
RBC: 3.83 10*6/uL (ref 3.80–5.20)
RDW: 15.2 % — AB (ref 11.5–14.5)
WBC: 6.9 10*3/uL (ref 3.6–11.0)

## 2013-07-26 LAB — TROPONIN I
TROPONIN-I: 0.07 ng/mL — AB
Troponin-I: 0.11 ng/mL — ABNORMAL HIGH
Troponin-I: 0.1 ng/mL — ABNORMAL HIGH

## 2013-07-26 LAB — PRO B NATRIURETIC PEPTIDE: B-TYPE NATIURETIC PEPTID: 33277 pg/mL — AB (ref 0–450)

## 2013-07-27 LAB — LIPID PANEL
Cholesterol: 114 mg/dL (ref 0–200)
HDL Cholesterol: 50 mg/dL (ref 40–60)
Ldl Cholesterol, Calc: 52 mg/dL (ref 0–100)
TRIGLYCERIDES: 58 mg/dL (ref 0–200)
VLDL Cholesterol, Calc: 12 mg/dL (ref 5–40)

## 2013-07-27 LAB — COMPREHENSIVE METABOLIC PANEL
ANION GAP: 2 — AB (ref 7–16)
AST: 21 U/L (ref 15–37)
Albumin: 3.2 g/dL — ABNORMAL LOW (ref 3.4–5.0)
Alkaline Phosphatase: 79 U/L
BUN: 44 mg/dL — ABNORMAL HIGH (ref 7–18)
Bilirubin,Total: 0.4 mg/dL (ref 0.2–1.0)
CALCIUM: 8.2 mg/dL — AB (ref 8.5–10.1)
Chloride: 103 mmol/L (ref 98–107)
Co2: 32 mmol/L (ref 21–32)
Creatinine: 1.7 mg/dL — ABNORMAL HIGH (ref 0.60–1.30)
EGFR (Non-African Amer.): 29 — ABNORMAL LOW
GFR CALC AF AMER: 34 — AB
GLUCOSE: 148 mg/dL — AB (ref 65–99)
Osmolality: 288 (ref 275–301)
Potassium: 4.9 mmol/L (ref 3.5–5.1)
SGPT (ALT): 19 U/L (ref 12–78)
SODIUM: 137 mmol/L (ref 136–145)
Total Protein: 6.7 g/dL (ref 6.4–8.2)

## 2013-07-27 LAB — CBC WITH DIFFERENTIAL/PLATELET
BASOS PCT: 0.4 %
Basophil #: 0 10*3/uL (ref 0.0–0.1)
EOS ABS: 0 10*3/uL (ref 0.0–0.7)
Eosinophil %: 0.1 %
HCT: 35.8 % (ref 35.0–47.0)
HGB: 11.5 g/dL — ABNORMAL LOW (ref 12.0–16.0)
Lymphocyte #: 0.2 10*3/uL — ABNORMAL LOW (ref 1.0–3.6)
Lymphocyte %: 6.7 %
MCH: 30.1 pg (ref 26.0–34.0)
MCHC: 32.1 g/dL (ref 32.0–36.0)
MCV: 94 fL (ref 80–100)
Monocyte #: 0 x10 3/mm — ABNORMAL LOW (ref 0.2–0.9)
Monocyte %: 1.2 %
NEUTROS PCT: 91.6 %
Neutrophil #: 2.7 10*3/uL (ref 1.4–6.5)
PLATELETS: 132 10*3/uL — AB (ref 150–440)
RBC: 3.81 10*6/uL (ref 3.80–5.20)
RDW: 15 % — AB (ref 11.5–14.5)
WBC: 3 10*3/uL — AB (ref 3.6–11.0)

## 2013-07-28 LAB — BASIC METABOLIC PANEL
Anion Gap: 3 — ABNORMAL LOW (ref 7–16)
BUN: 55 mg/dL — ABNORMAL HIGH (ref 7–18)
CALCIUM: 7.9 mg/dL — AB (ref 8.5–10.1)
Chloride: 100 mmol/L (ref 98–107)
Co2: 33 mmol/L — ABNORMAL HIGH (ref 21–32)
Creatinine: 1.84 mg/dL — ABNORMAL HIGH (ref 0.60–1.30)
EGFR (African American): 31 — ABNORMAL LOW
EGFR (Non-African Amer.): 26 — ABNORMAL LOW
Glucose: 147 mg/dL — ABNORMAL HIGH (ref 65–99)
Osmolality: 290 (ref 275–301)
POTASSIUM: 4.8 mmol/L (ref 3.5–5.1)
Sodium: 136 mmol/L (ref 136–145)

## 2013-07-28 LAB — CBC WITH DIFFERENTIAL/PLATELET
Basophil #: 0 10*3/uL (ref 0.0–0.1)
Basophil %: 0.1 %
EOS PCT: 0 %
Eosinophil #: 0 10*3/uL (ref 0.0–0.7)
HCT: 31.8 % — ABNORMAL LOW (ref 35.0–47.0)
HGB: 10.3 g/dL — ABNORMAL LOW (ref 12.0–16.0)
LYMPHS ABS: 0.3 10*3/uL — AB (ref 1.0–3.6)
Lymphocyte %: 4.5 %
MCH: 30.4 pg (ref 26.0–34.0)
MCHC: 32.5 g/dL (ref 32.0–36.0)
MCV: 94 fL (ref 80–100)
MONOS PCT: 5 %
Monocyte #: 0.3 x10 3/mm (ref 0.2–0.9)
NEUTROS ABS: 6.3 10*3/uL (ref 1.4–6.5)
NEUTROS PCT: 90.4 %
Platelet: 125 10*3/uL — ABNORMAL LOW (ref 150–440)
RBC: 3.4 10*6/uL — ABNORMAL LOW (ref 3.80–5.20)
RDW: 15 % — AB (ref 11.5–14.5)
WBC: 6.9 10*3/uL (ref 3.6–11.0)

## 2013-08-29 ENCOUNTER — Inpatient Hospital Stay: Payer: Self-pay

## 2013-08-29 LAB — COMPREHENSIVE METABOLIC PANEL
AST: 39 U/L — AB (ref 15–37)
Albumin: 3.6 g/dL (ref 3.4–5.0)
Alkaline Phosphatase: 92 U/L
Anion Gap: 6 — ABNORMAL LOW (ref 7–16)
BILIRUBIN TOTAL: 0.3 mg/dL (ref 0.2–1.0)
BUN: 47 mg/dL — AB (ref 7–18)
Calcium, Total: 8 mg/dL — ABNORMAL LOW (ref 8.5–10.1)
Chloride: 103 mmol/L (ref 98–107)
Co2: 29 mmol/L (ref 21–32)
Creatinine: 2.11 mg/dL — ABNORMAL HIGH (ref 0.60–1.30)
EGFR (Non-African Amer.): 22 — ABNORMAL LOW
GFR CALC AF AMER: 26 — AB
Glucose: 227 mg/dL — ABNORMAL HIGH (ref 65–99)
Osmolality: 295 (ref 275–301)
Potassium: 4.6 mmol/L (ref 3.5–5.1)
SGPT (ALT): 35 U/L (ref 12–78)
SODIUM: 138 mmol/L (ref 136–145)
TOTAL PROTEIN: 7.3 g/dL (ref 6.4–8.2)

## 2013-08-29 LAB — CBC
HCT: 37.3 % (ref 35.0–47.0)
HGB: 11.8 g/dL — ABNORMAL LOW (ref 12.0–16.0)
MCH: 29.7 pg (ref 26.0–34.0)
MCHC: 31.7 g/dL — ABNORMAL LOW (ref 32.0–36.0)
MCV: 94 fL (ref 80–100)
Platelet: 217 10*3/uL (ref 150–440)
RBC: 3.98 10*6/uL (ref 3.80–5.20)
RDW: 15.5 % — AB (ref 11.5–14.5)
WBC: 6.5 10*3/uL (ref 3.6–11.0)

## 2013-08-29 LAB — TROPONIN I: Troponin-I: 0.07 ng/mL — ABNORMAL HIGH

## 2013-08-30 LAB — CBC WITH DIFFERENTIAL/PLATELET
BASOS ABS: 0 10*3/uL (ref 0.0–0.1)
BASOS PCT: 0.1 %
EOS ABS: 0 10*3/uL (ref 0.0–0.7)
EOS PCT: 0 %
HCT: 35.2 % (ref 35.0–47.0)
HGB: 11.4 g/dL — ABNORMAL LOW (ref 12.0–16.0)
Lymphocyte #: 0.4 10*3/uL — ABNORMAL LOW (ref 1.0–3.6)
Lymphocyte %: 8.9 %
MCH: 30.1 pg (ref 26.0–34.0)
MCHC: 32.3 g/dL (ref 32.0–36.0)
MCV: 93 fL (ref 80–100)
MONOS PCT: 1.4 %
Monocyte #: 0.1 x10 3/mm — ABNORMAL LOW (ref 0.2–0.9)
Neutrophil #: 3.9 10*3/uL (ref 1.4–6.5)
Neutrophil %: 89.6 %
Platelet: 186 10*3/uL (ref 150–440)
RBC: 3.79 10*6/uL — AB (ref 3.80–5.20)
RDW: 15.8 % — ABNORMAL HIGH (ref 11.5–14.5)
WBC: 4.3 10*3/uL (ref 3.6–11.0)

## 2013-08-30 LAB — BASIC METABOLIC PANEL
Anion Gap: 5 — ABNORMAL LOW (ref 7–16)
BUN: 48 mg/dL — ABNORMAL HIGH (ref 7–18)
CALCIUM: 8.3 mg/dL — AB (ref 8.5–10.1)
CO2: 29 mmol/L (ref 21–32)
Chloride: 101 mmol/L (ref 98–107)
Creatinine: 2.21 mg/dL — ABNORMAL HIGH (ref 0.60–1.30)
EGFR (African American): 24 — ABNORMAL LOW
GFR CALC NON AF AMER: 21 — AB
GLUCOSE: 175 mg/dL — AB (ref 65–99)
OSMOLALITY: 287 (ref 275–301)
Potassium: 5 mmol/L (ref 3.5–5.1)
Sodium: 135 mmol/L — ABNORMAL LOW (ref 136–145)

## 2013-08-31 LAB — BASIC METABOLIC PANEL
Anion Gap: 6 — ABNORMAL LOW (ref 7–16)
BUN: 58 mg/dL — AB (ref 7–18)
CALCIUM: 8.3 mg/dL — AB (ref 8.5–10.1)
CHLORIDE: 95 mmol/L — AB (ref 98–107)
CREATININE: 2.26 mg/dL — AB (ref 0.60–1.30)
Co2: 28 mmol/L (ref 21–32)
EGFR (African American): 24 — ABNORMAL LOW
EGFR (Non-African Amer.): 21 — ABNORMAL LOW
Glucose: 156 mg/dL — ABNORMAL HIGH (ref 65–99)
OSMOLALITY: 278 (ref 275–301)
Potassium: 5.3 mmol/L — ABNORMAL HIGH (ref 3.5–5.1)
Sodium: 129 mmol/L — ABNORMAL LOW (ref 136–145)

## 2013-08-31 LAB — CBC WITH DIFFERENTIAL/PLATELET
BASOS ABS: 0 10*3/uL (ref 0.0–0.1)
BASOS PCT: 0.1 %
EOS PCT: 0 %
Eosinophil #: 0 10*3/uL (ref 0.0–0.7)
HCT: 35.7 % (ref 35.0–47.0)
HGB: 11.1 g/dL — AB (ref 12.0–16.0)
Lymphocyte #: 0.4 10*3/uL — ABNORMAL LOW (ref 1.0–3.6)
Lymphocyte %: 6.1 %
MCH: 29 pg (ref 26.0–34.0)
MCHC: 31.2 g/dL — ABNORMAL LOW (ref 32.0–36.0)
MCV: 93 fL (ref 80–100)
Monocyte #: 0.2 x10 3/mm (ref 0.2–0.9)
Monocyte %: 3 %
Neutrophil #: 5.6 10*3/uL (ref 1.4–6.5)
Neutrophil %: 90.8 %
Platelet: 197 10*3/uL (ref 150–440)
RBC: 3.84 10*6/uL (ref 3.80–5.20)
RDW: 15.6 % — ABNORMAL HIGH (ref 11.5–14.5)
WBC: 6.2 10*3/uL (ref 3.6–11.0)

## 2013-09-01 LAB — BASIC METABOLIC PANEL
ANION GAP: 8 (ref 7–16)
BUN: 77 mg/dL — AB (ref 7–18)
CALCIUM: 8 mg/dL — AB (ref 8.5–10.1)
Chloride: 92 mmol/L — ABNORMAL LOW (ref 98–107)
Co2: 29 mmol/L (ref 21–32)
Creatinine: 2.21 mg/dL — ABNORMAL HIGH (ref 0.60–1.30)
EGFR (African American): 24 — ABNORMAL LOW
EGFR (Non-African Amer.): 21 — ABNORMAL LOW
Glucose: 147 mg/dL — ABNORMAL HIGH (ref 65–99)
Osmolality: 285 (ref 275–301)
Potassium: 5.5 mmol/L — ABNORMAL HIGH (ref 3.5–5.1)
Sodium: 129 mmol/L — ABNORMAL LOW (ref 136–145)

## 2013-09-02 LAB — BASIC METABOLIC PANEL
Anion Gap: 9 (ref 7–16)
BUN: 82 mg/dL — AB (ref 7–18)
CREATININE: 2.27 mg/dL — AB (ref 0.60–1.30)
Calcium, Total: 7.7 mg/dL — ABNORMAL LOW (ref 8.5–10.1)
Chloride: 94 mmol/L — ABNORMAL LOW (ref 98–107)
Co2: 27 mmol/L (ref 21–32)
EGFR (African American): 24 — ABNORMAL LOW
EGFR (Non-African Amer.): 20 — ABNORMAL LOW
Glucose: 148 mg/dL — ABNORMAL HIGH (ref 65–99)
Osmolality: 288 (ref 275–301)
Potassium: 4.8 mmol/L (ref 3.5–5.1)
SODIUM: 130 mmol/L — AB (ref 136–145)

## 2013-09-02 LAB — VANCOMYCIN, TROUGH: VANCOMYCIN, TROUGH: 7 ug/mL — AB (ref 10–20)

## 2013-09-03 LAB — BASIC METABOLIC PANEL
Anion Gap: 10 (ref 7–16)
BUN: 84 mg/dL — ABNORMAL HIGH (ref 7–18)
Calcium, Total: 7.6 mg/dL — ABNORMAL LOW (ref 8.5–10.1)
Chloride: 92 mmol/L — ABNORMAL LOW (ref 98–107)
Co2: 28 mmol/L (ref 21–32)
Creatinine: 1.91 mg/dL — ABNORMAL HIGH (ref 0.60–1.30)
GFR CALC AF AMER: 29 — AB
GFR CALC NON AF AMER: 25 — AB
Glucose: 178 mg/dL — ABNORMAL HIGH (ref 65–99)
OSMOLALITY: 291 (ref 275–301)
Potassium: 5.2 mmol/L — ABNORMAL HIGH (ref 3.5–5.1)
Sodium: 130 mmol/L — ABNORMAL LOW (ref 136–145)

## 2013-09-03 LAB — CULTURE, BLOOD (SINGLE)

## 2013-09-04 LAB — BASIC METABOLIC PANEL
Anion Gap: 6 — ABNORMAL LOW (ref 7–16)
BUN: 82 mg/dL — AB (ref 7–18)
CO2: 30 mmol/L (ref 21–32)
Calcium, Total: 8 mg/dL — ABNORMAL LOW (ref 8.5–10.1)
Chloride: 95 mmol/L — ABNORMAL LOW (ref 98–107)
Creatinine: 1.87 mg/dL — ABNORMAL HIGH (ref 0.60–1.30)
GFR CALC AF AMER: 30 — AB
GFR CALC NON AF AMER: 26 — AB
GLUCOSE: 172 mg/dL — AB (ref 65–99)
Osmolality: 292 (ref 275–301)
Potassium: 5 mmol/L (ref 3.5–5.1)
SODIUM: 131 mmol/L — AB (ref 136–145)

## 2013-09-04 LAB — PLATELET COUNT: Platelet: 173 10*3/uL (ref 150–440)

## 2013-09-23 ENCOUNTER — Ambulatory Visit: Payer: Self-pay | Admitting: Internal Medicine

## 2013-10-08 ENCOUNTER — Inpatient Hospital Stay: Payer: Self-pay

## 2013-10-08 LAB — URINALYSIS, COMPLETE
BLOOD: NEGATIVE
Bacteria: NONE SEEN
Bilirubin,UR: NEGATIVE
Glucose,UR: NEGATIVE mg/dL (ref 0–75)
Ketone: NEGATIVE
Leukocyte Esterase: NEGATIVE
Nitrite: NEGATIVE
PROTEIN: NEGATIVE
Ph: 5 (ref 4.5–8.0)
Specific Gravity: 1.008 (ref 1.003–1.030)

## 2013-10-08 LAB — COMPREHENSIVE METABOLIC PANEL
ALT: 60 U/L (ref 12–78)
AST: 37 U/L (ref 15–37)
Albumin: 3.6 g/dL (ref 3.4–5.0)
Alkaline Phosphatase: 85 U/L
Anion Gap: 9 (ref 7–16)
BILIRUBIN TOTAL: 0.5 mg/dL (ref 0.2–1.0)
BUN: 57 mg/dL — ABNORMAL HIGH (ref 7–18)
CHLORIDE: 101 mmol/L (ref 98–107)
Calcium, Total: 8.3 mg/dL — ABNORMAL LOW (ref 8.5–10.1)
Co2: 28 mmol/L (ref 21–32)
Creatinine: 2.23 mg/dL — ABNORMAL HIGH (ref 0.60–1.30)
EGFR (African American): 24 — ABNORMAL LOW
EGFR (Non-African Amer.): 21 — ABNORMAL LOW
GLUCOSE: 113 mg/dL — AB (ref 65–99)
OSMOLALITY: 292 (ref 275–301)
POTASSIUM: 3.8 mmol/L (ref 3.5–5.1)
SODIUM: 138 mmol/L (ref 136–145)
Total Protein: 6.5 g/dL (ref 6.4–8.2)

## 2013-10-08 LAB — CBC
HCT: 35.2 % (ref 35.0–47.0)
HGB: 11.3 g/dL — ABNORMAL LOW (ref 12.0–16.0)
MCH: 29.8 pg (ref 26.0–34.0)
MCHC: 32.2 g/dL (ref 32.0–36.0)
MCV: 92 fL (ref 80–100)
PLATELETS: 180 10*3/uL (ref 150–440)
RBC: 3.81 10*6/uL (ref 3.80–5.20)
RDW: 16.9 % — ABNORMAL HIGH (ref 11.5–14.5)
WBC: 9.6 10*3/uL (ref 3.6–11.0)

## 2013-10-08 LAB — TROPONIN I
TROPONIN-I: 0.07 ng/mL — AB
Troponin-I: 0.07 ng/mL — ABNORMAL HIGH

## 2013-10-08 LAB — PRO B NATRIURETIC PEPTIDE: B-TYPE NATIURETIC PEPTID: 59741 pg/mL — AB (ref 0–450)

## 2013-10-09 LAB — BASIC METABOLIC PANEL
ANION GAP: 8 (ref 7–16)
BUN: 59 mg/dL — ABNORMAL HIGH (ref 7–18)
CALCIUM: 8.1 mg/dL — AB (ref 8.5–10.1)
CO2: 32 mmol/L (ref 21–32)
Chloride: 102 mmol/L (ref 98–107)
Creatinine: 2.05 mg/dL — ABNORMAL HIGH (ref 0.60–1.30)
EGFR (Non-African Amer.): 23 — ABNORMAL LOW
GFR CALC AF AMER: 27 — AB
Glucose: 101 mg/dL — ABNORMAL HIGH (ref 65–99)
Osmolality: 300 (ref 275–301)
Potassium: 3.8 mmol/L (ref 3.5–5.1)
Sodium: 142 mmol/L (ref 136–145)

## 2013-10-10 LAB — BASIC METABOLIC PANEL
ANION GAP: 4 — AB (ref 7–16)
BUN: 63 mg/dL — AB (ref 7–18)
CREATININE: 1.95 mg/dL — AB (ref 0.60–1.30)
Calcium, Total: 8.6 mg/dL (ref 8.5–10.1)
Chloride: 101 mmol/L (ref 98–107)
Co2: 35 mmol/L — ABNORMAL HIGH (ref 21–32)
EGFR (Non-African Amer.): 24 — ABNORMAL LOW
GFR CALC AF AMER: 28 — AB
GLUCOSE: 92 mg/dL (ref 65–99)
Osmolality: 297 (ref 275–301)
Potassium: 3.9 mmol/L (ref 3.5–5.1)
SODIUM: 140 mmol/L (ref 136–145)

## 2013-10-11 LAB — BASIC METABOLIC PANEL
ANION GAP: 4 — AB (ref 7–16)
BUN: 58 mg/dL — ABNORMAL HIGH (ref 7–18)
CREATININE: 1.71 mg/dL — AB (ref 0.60–1.30)
Calcium, Total: 8.7 mg/dL (ref 8.5–10.1)
Chloride: 99 mmol/L (ref 98–107)
Co2: 35 mmol/L — ABNORMAL HIGH (ref 21–32)
EGFR (African American): 33 — ABNORMAL LOW
EGFR (Non-African Amer.): 29 — ABNORMAL LOW
Glucose: 105 mg/dL — ABNORMAL HIGH (ref 65–99)
Osmolality: 292 (ref 275–301)
Potassium: 3.9 mmol/L (ref 3.5–5.1)
Sodium: 138 mmol/L (ref 136–145)

## 2013-10-12 LAB — BASIC METABOLIC PANEL
Anion Gap: 1 — ABNORMAL LOW (ref 7–16)
BUN: 49 mg/dL — ABNORMAL HIGH (ref 7–18)
CHLORIDE: 99 mmol/L (ref 98–107)
Calcium, Total: 8.3 mg/dL — ABNORMAL LOW (ref 8.5–10.1)
Co2: 38 mmol/L — ABNORMAL HIGH (ref 21–32)
Creatinine: 1.6 mg/dL — ABNORMAL HIGH (ref 0.60–1.30)
EGFR (African American): 36 — ABNORMAL LOW
GFR CALC NON AF AMER: 31 — AB
Glucose: 103 mg/dL — ABNORMAL HIGH (ref 65–99)
Osmolality: 289 (ref 275–301)
POTASSIUM: 4.2 mmol/L (ref 3.5–5.1)
Sodium: 138 mmol/L (ref 136–145)

## 2013-10-13 LAB — BASIC METABOLIC PANEL
Anion Gap: 4 — ABNORMAL LOW (ref 7–16)
BUN: 43 mg/dL — ABNORMAL HIGH (ref 7–18)
CHLORIDE: 98 mmol/L (ref 98–107)
Calcium, Total: 8.2 mg/dL — ABNORMAL LOW (ref 8.5–10.1)
Co2: 38 mmol/L — ABNORMAL HIGH (ref 21–32)
Creatinine: 1.44 mg/dL — ABNORMAL HIGH (ref 0.60–1.30)
GFR CALC AF AMER: 41 — AB
GFR CALC NON AF AMER: 35 — AB
GLUCOSE: 89 mg/dL (ref 65–99)
OSMOLALITY: 290 (ref 275–301)
POTASSIUM: 3.8 mmol/L (ref 3.5–5.1)
Sodium: 140 mmol/L (ref 136–145)

## 2013-10-14 LAB — BASIC METABOLIC PANEL
ANION GAP: 4 — AB (ref 7–16)
BUN: 43 mg/dL — ABNORMAL HIGH (ref 7–18)
CREATININE: 1.62 mg/dL — AB (ref 0.60–1.30)
Calcium, Total: 8.6 mg/dL (ref 8.5–10.1)
Chloride: 94 mmol/L — ABNORMAL LOW (ref 98–107)
Co2: 38 mmol/L — ABNORMAL HIGH (ref 21–32)
EGFR (African American): 35 — ABNORMAL LOW
GFR CALC NON AF AMER: 31 — AB
Glucose: 126 mg/dL — ABNORMAL HIGH (ref 65–99)
Osmolality: 284 (ref 275–301)
POTASSIUM: 4.8 mmol/L (ref 3.5–5.1)
Sodium: 136 mmol/L (ref 136–145)

## 2013-10-23 ENCOUNTER — Ambulatory Visit: Payer: Self-pay | Admitting: Internal Medicine

## 2013-10-30 ENCOUNTER — Ambulatory Visit: Payer: Self-pay

## 2013-10-30 LAB — BASIC METABOLIC PANEL
Anion Gap: 9 (ref 7–16)
BUN: 30 mg/dL — AB (ref 7–18)
CALCIUM: 8.6 mg/dL (ref 8.5–10.1)
CREATININE: 2.08 mg/dL — AB (ref 0.60–1.30)
Chloride: 96 mmol/L — ABNORMAL LOW (ref 98–107)
Co2: 33 mmol/L — ABNORMAL HIGH (ref 21–32)
EGFR (African American): 26 — ABNORMAL LOW
GFR CALC NON AF AMER: 23 — AB
Glucose: 82 mg/dL (ref 65–99)
Osmolality: 281 (ref 275–301)
POTASSIUM: 3.9 mmol/L (ref 3.5–5.1)
Sodium: 138 mmol/L (ref 136–145)

## 2013-10-30 LAB — CBC
HCT: 38.6 % (ref 35.0–47.0)
HGB: 11.9 g/dL — AB (ref 12.0–16.0)
MCH: 28.5 pg (ref 26.0–34.0)
MCHC: 30.9 g/dL — ABNORMAL LOW (ref 32.0–36.0)
MCV: 92 fL (ref 80–100)
PLATELETS: 232 10*3/uL (ref 150–440)
RBC: 4.18 10*6/uL (ref 3.80–5.20)
RDW: 17.2 % — AB (ref 11.5–14.5)
WBC: 4.7 10*3/uL (ref 3.6–11.0)

## 2013-10-30 LAB — TROPONIN I: Troponin-I: 0.06 ng/mL — ABNORMAL HIGH

## 2013-10-31 LAB — CBC WITH DIFFERENTIAL/PLATELET
Basophil #: 0.1 10*3/uL (ref 0.0–0.1)
Basophil %: 1.2 %
EOS ABS: 0.1 10*3/uL (ref 0.0–0.7)
Eosinophil %: 2.1 %
HCT: 35.1 % (ref 35.0–47.0)
HGB: 11.1 g/dL — ABNORMAL LOW (ref 12.0–16.0)
LYMPHS ABS: 0.9 10*3/uL — AB (ref 1.0–3.6)
LYMPHS PCT: 15.6 %
MCH: 29.2 pg (ref 26.0–34.0)
MCHC: 31.6 g/dL — AB (ref 32.0–36.0)
MCV: 92 fL (ref 80–100)
Monocyte #: 0.3 x10 3/mm (ref 0.2–0.9)
Monocyte %: 5.4 %
NEUTROS ABS: 4.6 10*3/uL (ref 1.4–6.5)
NEUTROS PCT: 75.7 %
PLATELETS: 206 10*3/uL (ref 150–440)
RBC: 3.81 10*6/uL (ref 3.80–5.20)
RDW: 17.3 % — AB (ref 11.5–14.5)
WBC: 6.1 10*3/uL (ref 3.6–11.0)

## 2013-10-31 LAB — BASIC METABOLIC PANEL
Anion Gap: 11 (ref 7–16)
BUN: 29 mg/dL — ABNORMAL HIGH (ref 7–18)
CHLORIDE: 101 mmol/L (ref 98–107)
CO2: 28 mmol/L (ref 21–32)
CREATININE: 1.94 mg/dL — AB (ref 0.60–1.30)
Calcium, Total: 8.2 mg/dL — ABNORMAL LOW (ref 8.5–10.1)
EGFR (African American): 28 — ABNORMAL LOW
GFR CALC NON AF AMER: 25 — AB
Glucose: 61 mg/dL — ABNORMAL LOW (ref 65–99)
Osmolality: 283 (ref 275–301)
Potassium: 4.1 mmol/L (ref 3.5–5.1)
Sodium: 140 mmol/L (ref 136–145)

## 2013-10-31 LAB — MAGNESIUM: MAGNESIUM: 1.8 mg/dL

## 2013-10-31 LAB — TROPONIN I
TROPONIN-I: 0.07 ng/mL — AB
Troponin-I: 0.07 ng/mL — ABNORMAL HIGH
Troponin-I: 0.06 ng/mL — ABNORMAL HIGH

## 2013-10-31 LAB — CK-MB
CK-MB: 2.5 ng/mL (ref 0.5–3.6)
CK-MB: 2.5 ng/mL (ref 0.5–3.6)
CK-MB: 2.8 ng/mL (ref 0.5–3.6)

## 2013-11-22 ENCOUNTER — Ambulatory Visit: Payer: Self-pay | Admitting: Gastroenterology

## 2013-12-24 ENCOUNTER — Ambulatory Visit: Payer: Self-pay | Admitting: Internal Medicine

## 2014-01-13 ENCOUNTER — Inpatient Hospital Stay: Payer: Self-pay | Admitting: Internal Medicine

## 2014-01-13 LAB — CBC WITH DIFFERENTIAL/PLATELET
BASOS PCT: 0.5 %
Basophil #: 0 10*3/uL (ref 0.0–0.1)
Eosinophil #: 0 10*3/uL (ref 0.0–0.7)
Eosinophil %: 0 %
HCT: 35.4 % (ref 35.0–47.0)
HGB: 10.8 g/dL — ABNORMAL LOW (ref 12.0–16.0)
Lymphocyte #: 0.4 10*3/uL — ABNORMAL LOW (ref 1.0–3.6)
Lymphocyte %: 5.2 %
MCH: 27.6 pg (ref 26.0–34.0)
MCHC: 30.5 g/dL — AB (ref 32.0–36.0)
MCV: 90 fL (ref 80–100)
Monocyte #: 0.6 x10 3/mm (ref 0.2–0.9)
Monocyte %: 7.8 %
Neutrophil #: 6.4 10*3/uL (ref 1.4–6.5)
Neutrophil %: 86.5 %
Platelet: 146 10*3/uL — ABNORMAL LOW (ref 150–440)
RBC: 3.92 10*6/uL (ref 3.80–5.20)
RDW: 17.5 % — AB (ref 11.5–14.5)
WBC: 7.4 10*3/uL (ref 3.6–11.0)

## 2014-01-13 LAB — BASIC METABOLIC PANEL
ANION GAP: 11 (ref 7–16)
BUN: 48 mg/dL — AB (ref 7–18)
CO2: 27 mmol/L (ref 21–32)
CREATININE: 2.93 mg/dL — AB (ref 0.60–1.30)
Calcium, Total: 8.2 mg/dL — ABNORMAL LOW (ref 8.5–10.1)
Chloride: 96 mmol/L — ABNORMAL LOW (ref 98–107)
EGFR (Non-African Amer.): 15 — ABNORMAL LOW
GFR CALC AF AMER: 17 — AB
Glucose: 92 mg/dL (ref 65–99)
Osmolality: 280 (ref 275–301)
Potassium: 5.1 mmol/L (ref 3.5–5.1)
Sodium: 134 mmol/L — ABNORMAL LOW (ref 136–145)

## 2014-01-14 LAB — BASIC METABOLIC PANEL
ANION GAP: 7 (ref 7–16)
BUN: 55 mg/dL — AB (ref 7–18)
CHLORIDE: 93 mmol/L — AB (ref 98–107)
Calcium, Total: 7.6 mg/dL — ABNORMAL LOW (ref 8.5–10.1)
Co2: 30 mmol/L (ref 21–32)
Creatinine: 3.89 mg/dL — ABNORMAL HIGH (ref 0.60–1.30)
EGFR (African American): 12 — ABNORMAL LOW
EGFR (Non-African Amer.): 11 — ABNORMAL LOW
Glucose: 86 mg/dL (ref 65–99)
Osmolality: 275 (ref 275–301)
Potassium: 5.9 mmol/L — ABNORMAL HIGH (ref 3.5–5.1)
Sodium: 130 mmol/L — ABNORMAL LOW (ref 136–145)

## 2014-01-15 IMAGING — CR DG CHEST 2V
1 series · 2 of 2 positions shown · non-contrast
Comparison: none

REASON FOR EXAM: chest pain
COMMENTS:   May transport without cardiac monitor

[Series 1: pa · 0.17mm/px · 2 of 2 slices shown]
[im 1/2]
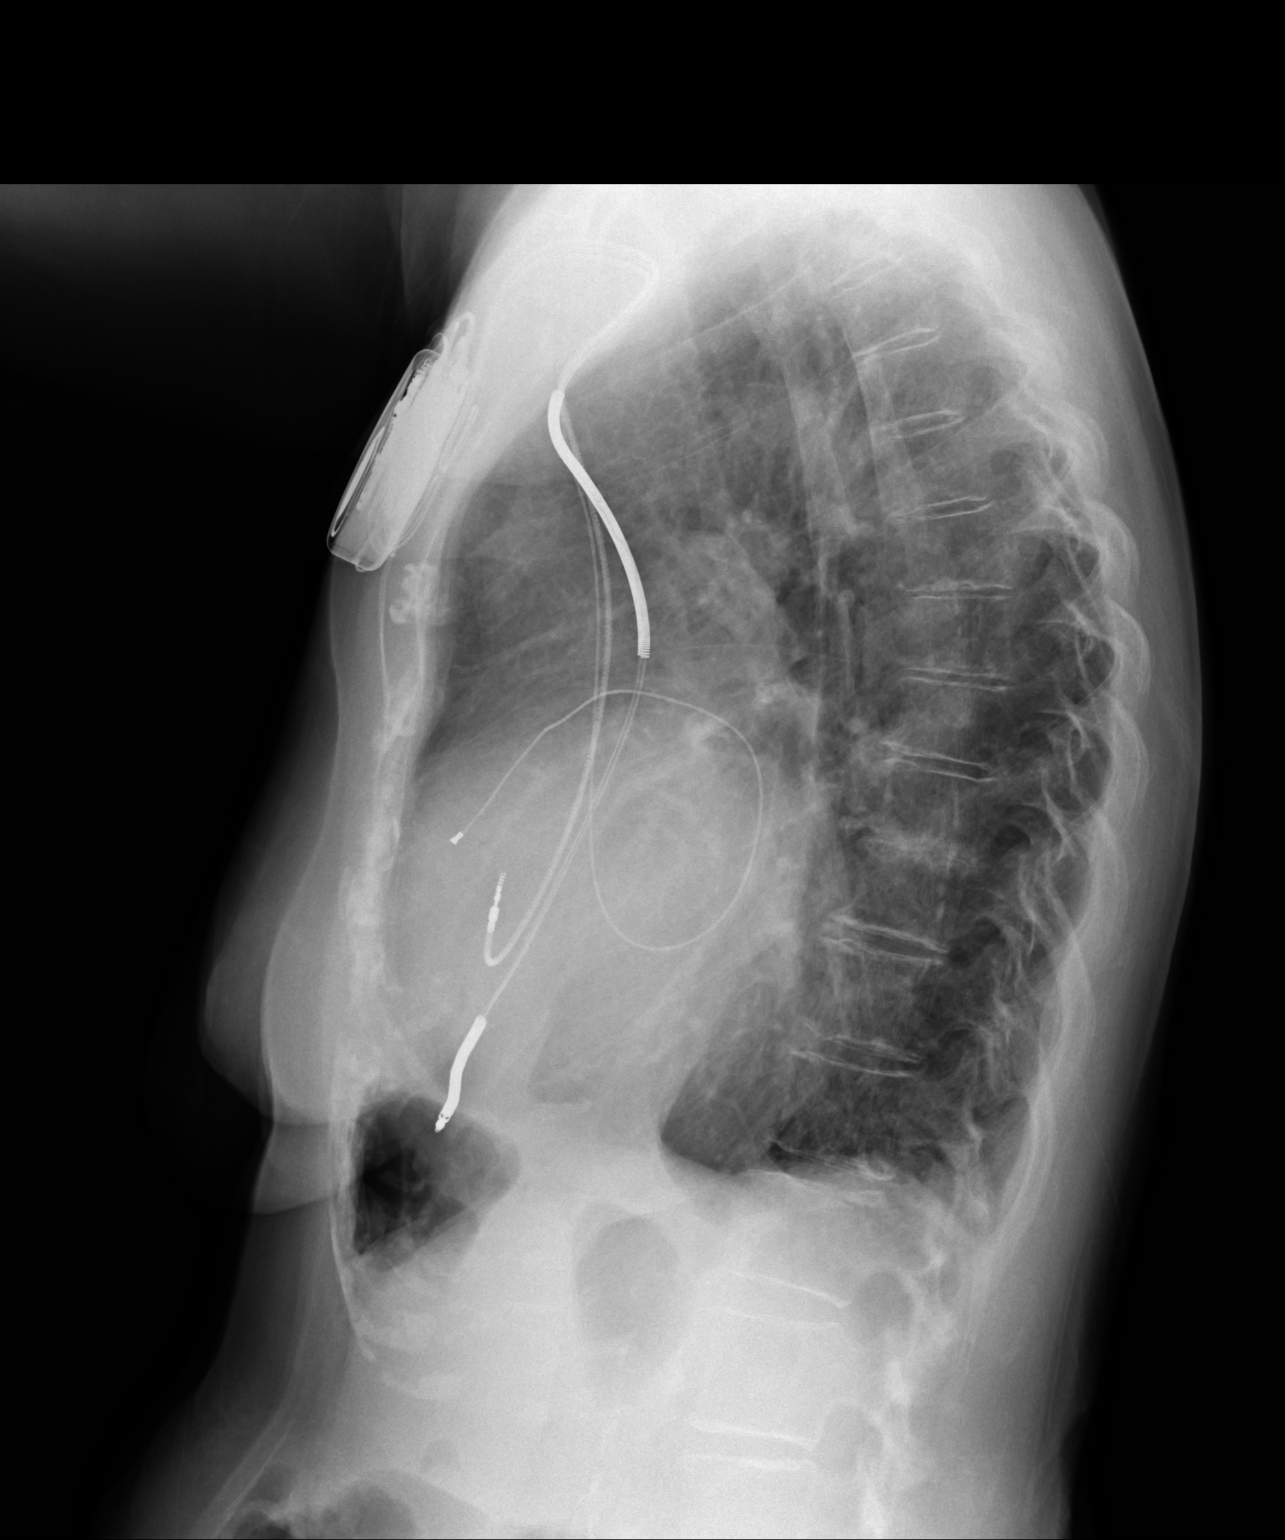
[im 2/2]
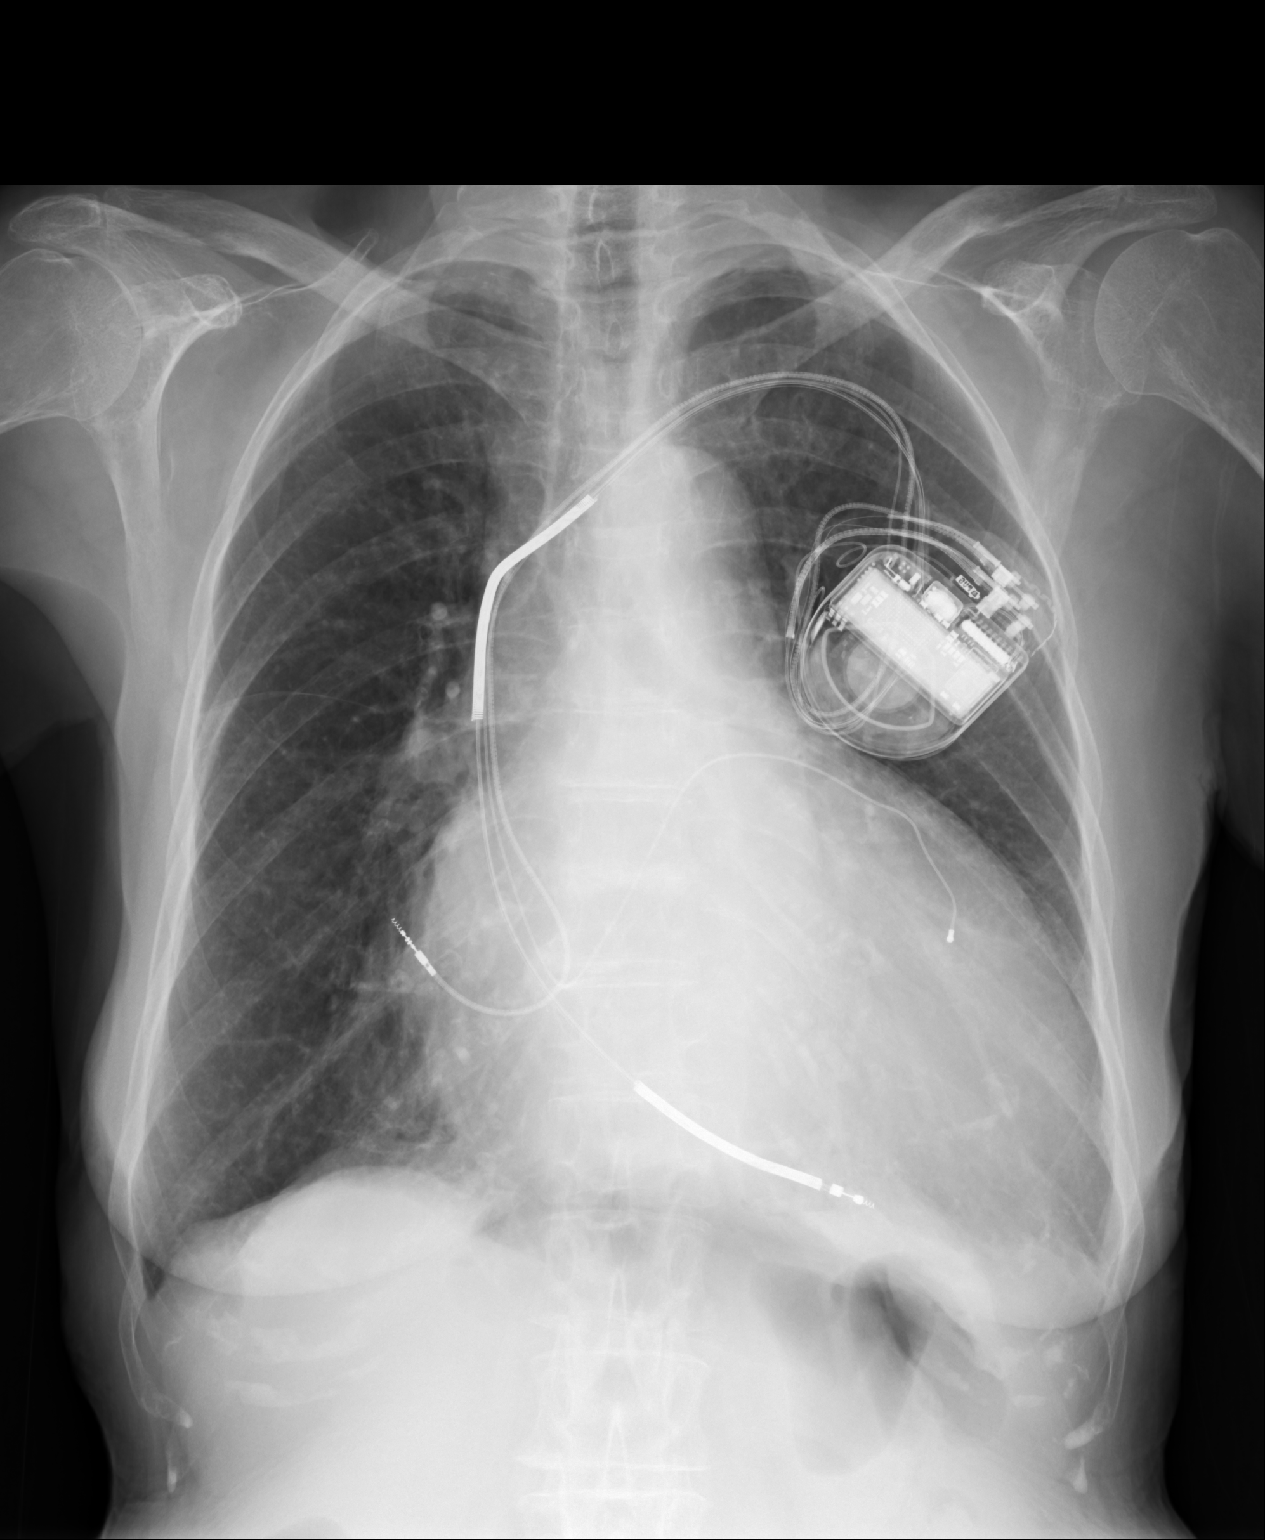

[2 of 2 positions shown; findings below may reference images not displayed]

PROCEDURE:     DXR - DXR CHEST PA (OR AP) AND LATERAL  - August 29, 2012  [DATE]

RESULT:     The cardiac silhouette is enlarged but unchanged compared to the
study 16 August, 2012. Pacemaker defibrillator appears unchanged in
position. The lungs are hyperinflated consistent with COPD but appear clear.
Bony structures are unremarkable.
IMPRESSION: 1. Stable cardiomegaly. The lungs are hyperinflated consistent with COPD. No
acute cardiopulmonary disease.

[REDACTED]

## 2014-01-23 ENCOUNTER — Ambulatory Visit: Payer: Self-pay | Admitting: Internal Medicine

## 2014-01-23 DEATH — deceased

## 2014-08-15 NOTE — H&P (Signed)
Past Med/Surgical Hx:  CHF:   Hypothyroid:   Hypertension:   COPD:   ALLERGIES:  PCN: Hives  Levaquin: Hives  Sulfa drugs: Hives   Assessment/Admission Diagnosis 79 yo female w/ hx of COPD, hx of Cardiomyopathy s/p AICD, hypothyroidism, Hyperlipidemia, came into hospital w/ shortness of breath.   1. Dyspnea - likely CHF,not much wheezing or elevated wbc, but with little scant greenish sputum production - will diurese w/ IV lasix and follow I's and O's and daily weights.  - Xopenex nebs q 6 hrs, cont. spiriva. doxycycline - prn O2  2. sCHF- likely acute on chronic systolic dysfunction.  - IV lasix  20mg   and follow I's and O's and daily weights.  - not on B-blocker and will start low dose Coreg if tolerated.  - Cardiology consult [facility])  3. Acute Renal Failure - baseline Cr from last admission 1.3-1.6 - related to CHF.  will diurese w/ Lasix and follow BUn/Cr and urine output.   4. Hypothyroidism - cont. synthroid.   5. Hyperlipidemia - cont. Pravachol  6. hx of Cardiomyopathy w/ EF of < 10% - cont. low dose B-blocker (as tolerated), Lasix as tolerated.    FULL CODE PMD - Dr.Fitzgerald  Time spent evaluating patient = 55 minutes ZOX#[id number]#[id number]   Electronic Signatures: Stephanie AcreMungal, Jamison Soward (MD)  (Signed 18-Apr-14 18:25)  Authored: PAST MEDICAL/SURGIAL HISTORY, ALLERGIES, HOME MEDICATIONS, ASSESSMENT AND PLAN   Last Updated: 18-Apr-14 18:25 by Stephanie AcreMungal, Amica Harron (MD)

## 2014-08-15 NOTE — Consult Note (Signed)
PATIENT NAME:  [name] [name], [name] [name] MR#:  [id number] DATE OF BIRTH:  February 21, 1936  DATE OF CONSULTATION:  08/11/2012  CONSULTING PHYSICIAN:  Lamar BlinksBruce J. Vaani Morren, MD, Dr. Allena KatzPatel  REASON FOR CONSULTATION: Severe dilated cardiomyopathy with previous cardiomyopathy and acute on chronic congestive heart failure.   CHIEF COMPLAINT:  "Just got short of breath."  HISTORY OF PRESENT ILLNESS: This is a 79 year old female with known severe dilated cardiomyopathy with recent ejection fraction of approximately 15% with valvular heart disease and pulmonary hypertension. The patient has been on appropriate medication management and recently was hospitalized for acute on chronic congestive heart failure secondary to pneumonia and hypoxia. The patient does have some chronic kidney disease for which she has had some worsening chronic kidney disease at this time. She came in with severe shortness of breath and hypoxia that did respond to low-dose Lasix. She is mildly improved at this time and currently has no evidence of acute myocardial infarction. EKG shows normal sinus rhythm with ventricular pacing. The remainder of review of systems negative for syncope, dizziness, nausea, chest pain, diaphoresis, vision change, ringing in the ears, hearing loss, cough, congestion, heartburn, nausea, vomiting, diarrhea, bloody stool, frequent urination, urination at night, muscle weakness, numbness, anxiety, depression, skin lesions, skin rashes.   PAST MEDICAL HISTORY: 1.  Normal coronary arteries by cardiac catheterization in the remote past.  2.  Severe dilated cardiomyopathy with valvular heart disease.  3.  Hypertension.  4.  Chronic kidney disease.   FAMILY HISTORY: No family members with early onset of cardiovascular disease or hypertension.   SOCIAL HISTORY:  Currently denies alcohol or tobacco use.   ALLERGIES:  As listed.   MEDICATIONS:  As listed.   PHYSICAL EXAMINATION: VITAL SIGNS:  Blood pressure is 90/30 bilaterally.  Heart rate is 70 upright, reclining and irregular.  GENERAL:  She is a well-appearing female in no acute distress.  HEAD, EYES, EARS, NOSE AND THROAT:  No icterus, thyromegaly, ulcers, hemorrhage CARDIOVASCULAR: Regular rate and rhythm. Normal S1, S2 with a III/VI apical murmur consistent with mitral regurgitation. PMI is diffuse. Carotid upstroke normal without bruit. appears normal.  LUNGS:  Have significant bibasilar crackles with decreased breath sounds in the bases.  ABDOMEN: Soft, nontender, without hepatosplenomegaly or masses. Abdominal aorta is normal size without bruit.  EXTREMITIES:  Show 2+ radial, trace femoral and dorsal pedal pulses with 1+ to 2+ lower extremity edema. No cyanosis, clubbing or ulcers.  NEUROLOGIC:  She is oriented to time, place and person with normal mood and affect.   ASSESSMENT: A 79 year old female with severe idiopathic dilated cardiomyopathy with chronic kidney disease with acute on chronic congestive heart failure with no current evidence of myocardial infarction.   RECOMMENDATIONS: 1.  Gentle diuresis due to chronic kidney disease and severe congestive heart failure, acute on chronic systolic with pulmonary edema. 2. Continue and/or reinstatement of low-dose beta blocker and gentle treatment with ACE inhibitor if able with chronic kidney disease.  3.  Continue oxygenation for significant chronic congestive heart failure.  4. Further consideration of echocardiogram for LV systolic dysfunction changes and LV dysfunction valvular heart disease.  5.  Further evaluation and treatment options of possible defibrillator interrogation, if necessary. 6.  Further treatment options after ambulation.    ____________________________ Lamar BlinksBruce J. Gabrielle Mester, MD bjk:ce D: 08/11/2012 11:52:53 ET T: 08/11/2012 12:27:18 ET JOB#: [id number]  cc: Lamar BlinksBruce J. Surafel Hilleary, MD, <Dictator> Lamar BlinksBRUCE J Mitzi Lilja MD ELECTRONICALLY SIGNED 08/15/2012 8:13

## 2014-08-15 NOTE — Discharge Summary (Signed)
PATIENT NAME:  [name] [name], [name] [name] MR#:  [id number] DATE OF BIRTH:  06-Jun-1935  DATE OF ADMISSION:  05/02/2012 DATE OF DISCHARGE:  05/15/2012   PRIMARY CARE PHYSICIAN: Stann Mainlandavid P. Sampson GoonFitzgerald, MD  CARDIOLOGIST: Lamar BlinksBruce J. Kowalski, MD   DISCHARGE DIAGNOSES:  1. Severe congestive heart failure, ejection fraction of 10%.  2. Acute renal failure, resolved.  3. Chronic obstructive pulmonary disease exacerbation.  4. Urinary tract infection with Escherichia coli, status post treatment.   HISTORY OF PRESENT ILLNESS: Please see admission history and physical done on January 8th. Briefly, this is a 79 year old woman with known severe CHF, EF of 10%, who was admitted with increasing shortness of breath and cough. She also has a history of COPD with ongoing tobacco abuse. She was found at that time to have markedly elevated BNP, evidence of volume overload on chest x-ray and evidence of COPD exacerbation. She also had a positive urinalysis.   HOSPITAL COURSE BY ISSUE:  1. Acute respiratory failure, likely due to a combination of chronic obstructive pulmonary disease and CHF. For the COPD, she was treated with steroids, nebulizers and antibiotics, and this slowly improved. For the congestive heart failure, she was started on gentle diuresis. She had known borderline blood pressure, so aggressive diuresis could not be completed. However, with IV Lasix daily, she has diuresed over 9 liters over the course of the hospitalization. Her blood pressure has remained stable, and her creatinine has improved with diuresis.  2. Acute renal failure. Her creatinine improved after diuresis and treatment of her urinary tract infection. It is now back to baseline.   DISCHARGE MEDICATIONS:  1. Lasix 40 mg once a day orally.  2. Prednisone taper, take as directed.  3. Tylenol 325 two q.4 hours p.r.n.  4. Calcium carbonate 500 mg twice a day.  5. Amiodarone 200 mg once a day.  6. Pravastatin 40 mg once a day.  7. Aspirin 325 once a  day.  8. Albuterol inhaler p.r.n.  9. Spiriva inhaler 1 inhaler once a day.  10. Levothyroxine 100 mcg daily.  11. B12 1 tablet once daily.   MEDICATIONS TO HOLD: The patient will hold the trandolapril 2 mg that she was taking as well as the spironolactone. These can be restarted as outpatient as needed.   OXYGEN: The patient will be discharged on oxygen 2 liters per nasal cannula.   DAY OF DISCHARGE LABORATORY: BUN 35, creatinine 1.40. This is improved from the max creatinine which peaked at 2.04 on January 13th. BUN at that time was 62. This improved with diuresis.   DISCHARGE INSTRUCTIONS:  1. The patient is to weigh herself daily and take an extra dose of Lasix if weight increases by more than 2 pounds. They are to call if the weight increases over 4 pounds. Also call if urine output decreases or dizziness occurs. The patient will also call for worsening shortness or breath. 2. Followup labs: The patient will need a basic metabolic panel done within 1 week of discharge.  3. Discharge followup: The patient will follow up with Dr. Sampson GoonFitzgerald and with cardiology within 1 to 2 weeks. [facility] FL2 was completed.   TIME SPENT: This discharge took 40 minutes.   ____________________________ Stann Mainlandavid P. Sampson GoonFitzgerald, MD dpf:OSi D: 05/15/2012 12:46:52 ET T: 05/15/2012 13:02:49 ET JOB#: [id number]  cc: Stann Mainlandavid P. Sampson GoonFitzgerald, MD, <Dictator> Shaunae Sieloff Sampson GoonFITZGERALD MD ELECTRONICALLY SIGNED 05/15/2012 19:24

## 2014-08-15 NOTE — H&P (Signed)
PATIENT NAME:  [name] [name], [name] [name] MR#:  [id number] DATE OF BIRTH:  20-May-1935  DATE OF ADMISSION:  05/02/2012  PRIMARY CARE PHYSICIAN: Stann Mainland. Sampson Goon, MD  CARDIOLOGIST: Lamar Blinks, MD  CHIEF COMPLAINT: Shortness of breath.   HISTORY OF PRESENT ILLNESS: This is a 79 year old female who presents to the Emergency Room due to shortness of breath ongoing for the past few days, progressively getting worse. The patient says that for the past 4 to 5 days, she has to sleep propped up, as she cannot lie flat. She has also noticed that she has developed significant peripheral edema over the past few days. She cannot recall how much weight she has gained. Normally when she sleeps, she does sleep with 2 pillows, but lately she has been sleeping much more propped up. She therefore came to the ER for further evaluation. In the Emergency Room, the patient was noted to be significantly tachypneic, short of breath, and emergently placed on BiPAP. Hospitalist services were contacted for further treatment and evaluation. The patient does admit to a cough, but it is nonproductive. She denies any palpitations. She denies any syncope. She denies any nausea, vomiting, abdominal pain, any sick contacts or any other associated symptoms.   REVIEW OF SYSTEMS:  CONSTITUTIONAL: No documented fever. Positive weight gain, unknown amount. No weight loss.  EYES: No blurry or double vision.  ENT: No tinnitus. No postnasal drip. No redness of the oropharynx.  RESPIRATORY: No cough, no wheeze, no hemoptysis.  CARDIOVASCULAR: No chest pain. Positive orthopnea. No palpitations, no syncope.  RESPIRATORY: Positive cough. No wheeze, no hemoptysis. Positive dyspnea on exertion.  GASTROINTESTINAL: No nausea, no vomiting, no diarrhea, no abdominal pain, no melena, no hematochezia.  GENITOURINARY: No dysuria, no hematuria.  ENDOCRINE: No polyuria or nocturia. No heat or cold intolerance.  HEMATOLOGIC: No anemia, no bruising, no  bleeding.  INTEGUMENTARY: No rashes. No lesions.  MUSCULOSKELETAL: No arthritis, no swelling, no gout.  NEUROLOGIC: No numbness, no tingling, no ataxia, no seizure-type activity.  PSYCHIATRIC: No anxiety, no insomnia, no ADD.   PAST MEDICAL HISTORY: Consistent with COPD with ongoing tobacco abuse, history of severe cardiomyopathy, ejection fraction of less than 10%, status post AICD, hypothyroidism, hyperlipidemia, hypertension.   ALLERGIES: LEVAQUIN, PENICILLIN AND SULFA DRUGS.   SOCIAL HISTORY: Does smoke about 1/4 to 1/2 pack per day, has been smoking for the past 50+ years. Occasional alcohol use. No illicit drug abuse. Lives with a roommate.   FAMILY HISTORY: The patient's father died from prostate cancer. Mother is alive. She has high blood pressure.   CURRENT MEDICATIONS: Amiodarone 200 mg daily, aspirin 325 mg daily, Lasix 40 mg daily, Synthroid 100 mcg daily, Pravachol 40 mg daily, Spiriva 1 puff daily, Aldactone 25 mg daily, trandolapril 2 mg daily and vitamin B12 one tab daily.   PHYSICAL EXAMINATION:   VITAL SIGNS: On admission: Temperature 98, pulse 119, respirations 20, blood pressure 87/50, sats 94% on BiPAP.  GENERAL: She is a pleasant-appearing female in mild respiratory distress.  HEENT: Atraumatic, normocephalic. Extraocular muscles are intact. Pupils equal and reactive to light. Sclerae anicteric. No conjunctival injection. No pharyngeal erythema.  NECK: Supple. There is no jugular venous distention, no bruits, no lymphadenopathy, no thyromegaly.  HEART: Tachycardic, irregular. No murmurs, no rubs, no clicks.  LUNGS: She has diffuse bibasilar crackles, positive use of accessory muscles. No dullness to percussion.  ABDOMEN: Soft, flat, nontender, nondistended. Has good bowel sounds. No hepatosplenomegaly appreciated.  EXTREMITIES: No evidence of any cyanosis or clubbing. Does  have +2 pitting edema from the knees to the ankles bilaterally, +2 pedal and radial pulses  bilaterally.  NEUROLOGIC: She is alert, awake, oriented x 3 with no focal motor or sensory deficits appreciated bilaterally.  SKIN: Moist and warm with no rash appreciated.  LYMPHATIC: There is no cervical or axillary lymphadenopathy.   LABORATORY AND RADIOLOGICAL DATA: Serum glucose 95, BUN 23, creatinine 1.6. BNP 45,000. Sodium 143, potassium 4.8, chloride 106, bicarbonate 31. Troponin 0.04. White cell count 11.1, hemoglobin 11.3, hematocrit 34.6, platelet count 301.   The patient did have a chest x-ray done, which showed COPD with cardiomegaly and pleural effusions with interstitial edema suggestive of congestive heart failure.   ASSESSMENT AND PLAN: This is a 79 year old female with a history of chronic obstructive pulmonary disease, cardiomyopathy, status post automatic implantable cardiac defibrillator, hypothyroidism, hyperlipidemia, presents to the hospital with shortness of breath and in acute respiratory failure.  1.  Acute respiratory failure: This is likely secondary to a combination of chronic obstructive pulmonary disease and congestive heart failure, but likely more congestive heart failure. She presents with orthopnea and lower extremity edema. I will diuresis her with IV Lasix. Follow ins and outs and daily weights. Will also give her Xopenex nebulizer treatments for her chronic obstructive pulmonary disease every 6 hours and continue her Spiriva. She is not very bronchospastic, therefore, hold off on IV steroids.  2.  Congestive heart failure: This is likely acute on chronic congestive heart failure, likely systolic dysfunction. She has a history of cardiomyopathy, ejection fraction of less than 10%. I will give her IV Lasix. Follow ins and outs and daily weights. Will hold her Aldactone and angiotensin-converting enzyme inhibitor given her renal failure and hypotension. She is currently not on a beta blocker; therefore, I will start her on low-dose Coreg for now. Will also consult  cardiology. The patient is well known to Dr. Gwen PoundsKowalski.  3.  Acute renal failure: Again, etiology unclear. Baseline creatinine is unknown, probably related to her congestive heart failure. Will diurese her with IV Lasix and follow BUN, creatinine and urine output. Renal-dose medications, avoid nephrotoxins, hold her angiotensin-converting enzyme inhibitor and Aldactone for now.  4.  Hypothyroidism: Continue Synthroid. 5.  Hyperlipidemia: Continue Pravachol. 6.  History of cardiomyopathy with ejection fraction of less than 10%.: Continue low-dose beta blocker and Lasix as tolerated. Hold Aldactone and angiotensin-converting enzyme inhibitor given hypotension.   The patient will be transferred over to Dr. Jarrett AblesFitzgerald's service.   TIME SPENT WITH ADMISSION: 50 minutes.   ____________________________ Rolly PancakeVivek J. Cherlynn KaiserSainani, MD vjs:jm D: 05/02/2012 21:09:50 ET T: 05/02/2012 21:24:26 ET JOB#: [id number]  cc: Rolly PancakeVivek J. Cherlynn KaiserSainani, MD, <Dictator> Houston SirenVIVEK J Aaleigha Bozza MD ELECTRONICALLY SIGNED 05/08/2012 15:34

## 2014-08-15 NOTE — Discharge Summary (Signed)
PATIENT NAME:  [name] [name], [name] [name] MR#:  [id number] DATE OF BIRTH:  [birth year]  DATE OF ADMISSION:  08/10/2012 DATE OF DISCHARGE:  08/19/2012  PRIMARY CARE [id number]: Clydie Braunavid Burch [place].   PRIMARY CARDIOLOGIST: Dr. Gwen PoundsKowalski.   DISCHARGE DIAGNOSES: 1.  Congestive heart failure exacerbation, acute systolic on chronic.  2.  Chronic obstructive pulmonary disease exacerbation.  3.  Acute-on-chronic renal insufficiency.  4.  Strep viridans bacteremia, likely a contaminant.   HISTORY OF PRESENT ILLNESS: Please see admission history and physical. Briefly, this is a 79 year old severe CHF as well as COPD admitted with shortness of breath.   HOSPITAL COURSE:  By issue:   1.  COPD: She was treated with a prednisone taper as well as nebulizers, and responded well. She received a 7-day course of doxycycline as well.  2.  CHF exacerbation: She has an EF of 10%. She was slowly diuresed with IV Lasix and was negative about 6 to 7 liters throughout the hospital course. She was able to be weaned off oxygen as well.  3.  Tachycardia, and nonsustained v-tach: The patient was started on amiodarone and increased to 200 mg once a day.  4.  Acute-on-chronic renal failure: The patient's creatinine was initially elevated, but improved with diuresis.   DISCHARGE MEDICATIONS: 1.  Amiodarone 200 mg once a day.  2.  Aspirin 325 mg once a day.  3.  Levothyroxine 100 mcg once a day.  4.  Pravastatin 40 once a day.  5.  Albuterol p.r.n.  6.  Calcium carbonate 1 tablet once a day.  7.  Spiriva 1 once a day.  8.  Vitamin B12 500 once a day.  9.  Symbicort 1 puff 2 times a day.  10.  Vitamin D3 once a day.  11.  Prednisone 20 mg tablet, 1 tablet a day for 3 days.  12. Turasemide 20 mg 1 tablet once a day (this is turasemide. This is to be taken in place of furosemide.   Prescriptions were sent in electronically to [facility].   Home health was arranged for CHF management. She has oxygen at home; receives 2 liters per nasal  cannula at nighttime.   DISCHARGE DIET: Low-sodium, regular consistency.   ACTIVITY: As tolerated.   FOLLOWUP: The patient will follow up with Dr. Sampson GoonFitzgerald and Dr. Gwen PoundsKowalski in 1 to 2 weeks' time.   This discharge took 35 minutes.    ____________________________ Stann Mainlandavid P. Sampson GoonFitzgerald, MD dpf:dm D: 08/19/2012 11:15:51 ET T: 08/19/2012 11:44:22 ET JOB#: [id number]  cc: Stann Mainlandavid P. Sampson GoonFitzgerald, MD, <Dictator> Ilyana Manuele Sampson GoonFITZGERALD MD ELECTRONICALLY SIGNED 08/22/2012 12:09

## 2014-08-15 NOTE — H&P (Signed)
PATIENT NAME:  [name] [name], [name] [name] MR#:  [id number] DATE OF BIRTH:  March 21, 1936  DATE OF ADMISSION:  08/10/2012  PRIMARY CARE PHYSICIAN:  Dr. Sampson GoonFitzgerald.  PRIMARY CARDIOLOGIST:  Dr. Arnoldo HookerBruce Kowalski.  EMERGENCY DEPARTMENT REFERRING PHYSICIAN:  Dr. Dorothea GlassmanPaul Malinda.   CHIEF COMPLAINT:  Shortness of breath and fluid.   HISTORY OF PRESENT ILLNESS:  The patient is a 79 year old female presents to the Emergency Room due to shortness of breath ongoing for the past few days progressively getting worse.  The patient says that for the past 4 to 5 days she has had to sleep upright.  She has noticed some mild leg swelling of the bilateral lower extremities and she cannot lie flat.  She gets very short of breath.  She also noticed that she has been having mild productive cough with greenish-tinged sputum at times.  Normally when she sleeps she does sleep with two pillows, but lately she has been sleeping much more propped up.  Therefore, she came to the ER for further evaluation.  In the Emergency Room, the patient was noted to be moderately tachypneic with shortness of breath and had an elevated heart rate.  Hospitalist services were contacted for further treatment and evaluation.  The patient does admit to, as above, a mild cough, but it is mildly productive.  She denies any palpitations.  Denies any syncope.  She denies any nausea, vomiting, abdominal pains, any sick contacts or any other associated symptoms.   PAST MEDICAL HISTORY:  Consistent with COPD, tobacco abuse, severe cardiomyopathy, systolic CHF, ejection fraction less than 10%, status post AICD, hypothyroidism, hyperlipidemia, hypertension, vitamin D deficiency.  ALLERGIES:  LEVAQUIN, PENICILLIN AND SULFA DRUGS.   SOCIAL HISTORY:  Does smoke about a quarter pack to 1/2 pack per day, has been smoking for the past 50+ years.  Occasional alcohol use.  No illicit drug use.  Lives with a roommate.   FAMILY HISTORY:  The patient's father died of prostate cancer.   Mother is alive.  She has high blood pressure.    HOME MEDICATIONS:  Are as follows:  Albuterol 1 puff every 4 to 6 hours as needed for shortness of breath, Symbicort 80/4.5 1 puff twice daily, amiodarone 200 mg 1 tab daily, aspirin 325 1 tab daily, calcium carbonate 500 mg 1 tab daily, furosemide 40 mg by mouth daily, levothyroxine 100 mcg by mouth once a day, pravastatin 40 mg by mouth daily, Spiriva 18 mcg 1 capsule via inhaler once a day, vitamin B12 500 mcg oral tablet 1 tab daily, vitamin D3 1 tab once daily.    REVIEW OF SYSTEMS:  CONSTITUTIONAL:  No documented fever.  Positive weight gain, unknown amount.  No weight loss.  EYES:  No blurred vision, double vision.  EARS, NOSE, THROAT:  No tinnitus, ear pain.  No postnasal drip, no redness of the oropharynx.  RESPIRATORY:  Positive cough.  No wheezing, no hemoptysis.  Positive dyspnea on exertion.  CARDIOVASCULAR:  No chest pain.  Positive orthopnea.  No palpitations or syncope.  GASTROINTESTINAL:  No nausea, vomiting, diarrhea, abdominal pain.  No melena, hematochezia.  GENITOURINARY:  No dysuria, hematuria.  ENDOCRINE:  No polyuria or nocturia.  No heat or cold intolerance.  HEMATOLOGIC:  No anemia, easy bruising, bleeding or swollen glands.  INTEGUMENTARY:  No acne.  No rash, no lesions.  No change in mole, hair or skin.  MUSCULOSKELETAL:  No arthritis.  No joint swelling, no gout.  NEUROLOGIC:  No numbness, no tingling, ataxia, no seizure activity.  PSYCHIATRIC:  No anxiety, OCD, bipolar, schizophrenia or ADD.   PHYSICAL EXAMINATION: VITAL SIGNS:  In the ED are, temperature 98.8, pulse at 130, respirations are 30, blood pressure at 95/68, pulse ox at 96% on 2 liters.  GENERAL:  This is a pleasant-appearing female in no acute respiratory distress lying in bed.  Well-nourished, well-developed.  HEENT:  Pupils are equal, round and reactive to light and accommodation.  Extraocular movements are intact.  No scleral icterus.  no difficulty  hearing.  TMs are intact.  No pharyngeal erythema.  Mucous membranes are moist.  Dentition is good.  NECK:  No thyroid enlargement.  Neck is supple, no tenderness.  No adenopathy is noted.  There is some mild JVD.  RESPIRATORY:  Coarse upper airway sounds, bilateral crackles to the mid lungs.  No wheezing or use of accessory muscles and no uses of the sternocleidal muscles either.  CARDIOVASCULAR:  S1, S2 auscultated, tachypneic.  There is some trace pedal edema noted.  No PMI lateralization.  Good pedal pulses are noted.  CHEST:  Nontender.  ABDOMEN:  Soft, nontender, nondistended.  Positive bowel sounds.  MUSCULOSKELETAL:  4/5 strength bilateral upper and lower extremities.  No cyanosis, DJD or kyphosis.  SKIN:  No rash, lesions, erythema, nodules.  Skin is warm and dry.  LYMPHATIC:  No adenopathy noted in the cervical, axilla or supraclavicular regions.  NEUROLOGIC:  Cranial nerves II through XII intact.  Deep tendon reflexes is intact.  Sensory intact.  PSYCHIATRIC:  Alert and oriented to person, time and place.  Cooperative.  Good judgment.  Memory is intact.   PERTINENT LABORATORY DATA:  Are as follows:  Her sodium is 136, potassium 5.1, chloride is 100, bicarbonate is 27, BUN is 48, creatinine 2.64 elevated from her last admission, glucose is 122.  Troponin is at 0.08.  White cell count at 8.5, hemoglobin 11.7, hematocrit 36.2, platelet count at 193.  She does have a chest x-ray that shows bilateral pulmonary edema consistent with previous x-ray in January of 2014.  She does have an EKG that shows no acute ST-T wave changes.  Moderate LVH, ventricular rate of about 130 with pacing spike.  She has an AICD in place.   ASSESSMENT AND PLAN:  Is as follows:  This is a 79 year old female with past medical history of chronic obstructive pulmonary disease, cardiomyopathy, status post automatic implantable cardioverter defibrillator, hypothyroidism, hyperlipidemia, came to the hospital with shortness of  breath and acute on chronic congestive heart failure.   1.  Shortness of breath, likely secondary to congestive heart failure, not much wheezing or elevated white cell count at this time, but with little scant greenish sputum production.  We will diurese with IV Lasix 20 mg and follow ins and outs and daily weight.  Xopenex nebs q. 6 hours.  Continue Spiriva, doxycycline and Symbicort.  As needed O2 as needed to maintain saturations greater than 88%.  2.  Diastolic congestive heart failure, likely acute on chronic diastolic dysfunction.  IV Lasix 20 mg and follow ins and outs.  Especially follow her creatinine.  We will put in for two doses at this time and monitor creatinine closely.  Cardiology consult with Dr. Gwen Pounds at this time also.  3.  Acute renal failure.  Baseline creatinine from last admission is 1.3 to 1.6, currently elevated at 2.6, most likely related to congestive heart failure and hypertension.  We will diurese with Lasix gently given her hypotension.  I will follow BUN, creatinine and urine  output.  4.  Hypothyroidism.  Continue with Synthroid.  5.  Hyperlipidemia.  Continue with Pravachol.  6.  History of cardiomyopathy with ejection fraction less than 10%.  Would like to attempt a low-dose beta-blocker, but given her current blood pressure we will put this on hold until she is seen officially by cardiology.  Lasix as tolerated and she does have an automatic implantable cardioverter defibrillator in place.  The patient is a full code.     Time spent dictating and evaluating the patient:  50 minutes.      ____________________________ Stephanie Acre, MD vm:ea D: 08/10/2012 18:24:00 ET T: 08/10/2012 23:24:13 ET JOB#: [id number]  cc: Stephanie Acre, MD, <Dictator> Stephanie Acre MD ELECTRONICALLY SIGNED 08/14/2012 10:17

## 2014-08-15 NOTE — Consult Note (Signed)
    General Aspect 79 yo female with history of idopathic cardiomyopathy, history of beventricular aicd, hypertensioin,hyperlipidemia and cva admitted with progressive worsening of her shortness of breath. Noted to be hypoxic and to have chf on cxr. She was admitted and aggressively diuresed and placed on bipap. She had reduced her lasix input recently due to diarhea. She has ruled out for an mi. She is hemodinamically stable and has improved with diurses.   Physical Exam:   GEN well developed, well nourished, no acute distress    HEENT PERRL, hearing intact to voice    NECK supple    RESP no use of accessory muscles  rhonchi  crackles    CARD Regular rate and rhythm  Murmur    Murmur Systolic    ABD denies tenderness  normal BS    LYMPH negative neck, negative axillae    EXTR negative cyanosis/clubbing, positive edema    SKIN normal to palpation    NEURO cranial nerves intact, motor/sensory function intact    PSYCH alert, anxious   Review of Systems:   Subjective/Chief Complaint shortness of breath    General: Fatigue  Weakness  Trouble sleeping    Skin: No Complaints    ENT: No Complaints    Eyes: No Complaints    Neck: No Complaints    Respiratory: Short of breath  Wheezing    Cardiovascular: Dyspnea  Edema    Gastrointestinal: No Complaints    Genitourinary: No Complaints    Vascular: No Complaints    Musculoskeletal: No Complaints    Neurologic: No Complaints    Hematologic: No Complaints    Endocrine: No Complaints    Psychiatric: Anxiety    Review of Systems: All other systems were reviewed and found to be negative    Medications/Allergies Reviewed Medications/Allergies reviewed        Admit Diagnosis:   428.0 ACUTE CHF COPD: 03-May-2012, Active, 428.0 ACUTE CHF COPD      Home Oxygen:   [employer]: 06-Feb-2003, Active, Pt on home 02 with [employer] at 3lpm.  EKG:   Interpretation paced rhythm    PCN: Hives  Levaquin: Hives  Sulfa drugs:  Hives    Impression 79 yo female with history of idiopathic cardiomyopathy admitted with progressive shortness of breath. She had reduced her lasix input recently due to diarhea. She was noted to be volume overloaded on cxr on admission and was relatively hypoxic. She has improved with diuretics and bronchodilators. She is stable and near her baseline. Her renal insuffiency is chronic.    Plan 1. Continue with careful diuresis 2. OK to transfer to telemetry. 3. Continue with her amiodarone, spironolactone and asa.  4. Ambulate in am and follow for symptoms   Electronic Signatures: Dalia HeadingFath, Charmaine Placido A (MD)  (Signed 09-Jan-14 15:40)  Authored: General Aspect/Present Illness, History and Physical Exam, Review of System, Health Issues, EKG , Allergies, Impression/Plan   Last Updated: 09-Jan-14 15:40 by Dalia HeadingFath, Lori Liew A (MD)

## 2014-08-16 NOTE — Consult Note (Signed)
PATIENT NAME:  [name] [name], [name] [name] MR#:  [id number] DATE OF BIRTH:  04/04/1936  DATE OF CONSULTATION:  07/26/2013  CONSULTING PHYSICIAN:  Marcina MillardAlexander Yassmine Tamm, MD  PRIMARY CARE PHYSICIAN: Sampson GoonFitzgerald, MD.  CARDIOLOGIST:  Gwen PoundsKowalski, MD.   CHIEF COMPLAINT: Shortness of breath.   HISTORY OF PRESENT ILLNESS: The patient is a 79 year old female with known history of cardiomyopathy, chronic systolic congestive heart failure, status post ICD with COPD, ongoing tobacco abuse admitted with shortness of breath. The patient reports that during the past 3 days she has noted increasing fluid retention, peripheral edema and shortness of breath. [facility] where she was noted to have elevated BNP of 30,000. Troponin was borderline elevated in the absence of chest pain. The patient was treated with intravenous furosemide with diuresis and overall clinical improvement.   PAST MEDICAL HISTORY: 1.  Dilated cardiomyopathy with LV ejection fraction less than 10%.  2.  Chronic systolic congestive heart failure.  3.  Status post ICD.  4.  COPD. 5.  Ongoing tobacco abuse.  6.  Hyperlipidemia.  7.  Hypertension.  8.  Hypothyroidism.   MEDICATIONS:   pravastatin 40 mg daily, aspirin 325 mg daily, amiodarone 200 mg daily, vitamin D3 one capsule daily, vitamin B12 500 mcg daily, Symbicort inhaler 1 puff b.i.d., Spiriva 18 mcg 1 capsule inhalation daily, levothyroxine 100 mcg daily, albuterol inhaler q. 4 to 6 hours p.r.n., Advair 250/50 one puff b.i.d.   SOCIAL HISTORY: The patient currently lives at home. She continues to smoke intermittently.   FAMILY HISTORY: No immediate family history of coronary artery disease or myocardial infarction.  REVIEW OF SYSTEMS:  CONSTITUTIONAL: No fever or chills.  EYES: No blurry vision.  EARS: No hearing loss.  RESPIRATORY: The patient has shortness of breath as described above.  CARDIOVASCULAR: The patient has had some mild pedal edema. Denies chest  pain. GASTROINTESTINAL: No nausea, vomiting or diarrhea.  GENITOURINARY: No dysuria or hematuria.  ENDOCRINE: No polyuria or polydipsia.  MUSCULOSKELETAL: No arthralgias or myalgias.  NEUROLOGICAL: No focal muscle weakness or numbness.  PSYCHOLOGICAL: No depression or anxiety.   PHYSICAL EXAMINATION: VITAL SIGNS: Blood pressure 96/56, pulse 76, respirations 18, temperature 98.1, pulse oximetry 91%.  HEENT: Pupils equal, reactive to light and accommodation.  NECK: Supple without thyromegaly.  LUNGS: Reveal decreased breath sounds in both bases.  HEART: Normal JVP. Diffuse PMI. Regular rate and rhythm. No appreciable gallop, murmur or rub.  ABDOMEN: Soft, nontender. Pulses were intact bilaterally.  MUSCULOSKELETAL: Normal muscle tone.  NEUROLOGIC: The patient is alert and oriented x 3. Motor and sensory both grossly intact.   IMPRESSION: A 79 year old female with dilated cardiomyopathy, left ventricular function of 10% with acute on chronic systolic congestive heart failure with borderline elevated troponin likely due to demand/supply ischemia in the absence of chest pain or ECG changes.   RECOMMENDATIONS: 1.  Continue present medications.  2.  Defer full dose anticoagulation.  3.  Continue diuresis.  4.  Defer further cardiac diagnostics at this time.   ____________________________ Marcina MillardAlexander Breeona Waid, MD ap:ce D: 07/26/2013 16:46:21 ET T: 07/26/2013 16:57:39 ET JOB#: [id number]  cc: Marcina MillardAlexander Joel Cowin, MD, <Dictator> Marcina MillardALEXANDER Iaan Oregel MD ELECTRONICALLY SIGNED 07/27/2013 13:22

## 2014-08-16 NOTE — Consult Note (Signed)
PATIENT NAME:  [name] GreaserCHAMPION, Vickki S MR#:  [id number] DATE OF BIRTH:  18-Dec-1935  DATE OF CONSULTATION:  10/30/2013  REFERRING PHYSICIAN:  Sheryl L. Mindi JunkerGottlieb, MD CONSULTING PHYSICIAN:  Christena DeemMartin U. Collis Thede, MD  REASON FOR CONSULTATION: Possible esophageal foreign body.   HISTORY OF PRESENT ILLNESS: Ms. [name] [name] is a 79 year old Caucasian female who states that last night she was eating a "Malaw[place]key burger." She felt a problem in her right side after eating some and then afterwards could not swallow further materials including liquids. She has not been able handle even drinking water. She is not having to spit in a cup as she is handling her secretions. She states that this morning, when she found that things were not going down quite right, she did eat a fruit cup. Since that time, she decided to come to the Emergency Room. She did have some chest discomfort earlier this morning. It is of note that she denies any history of heartburn or difficulty swallowing prior to this episode. She denies having to regurgitate foods or any dysphagia. She has been given different liquids in the Emergency Room. She was also given an injection of glucagon in an attempt to try to alleviate this problem and when I tested her with a small amount of water she brought this right back up and with it up came some of fruit cup. She has never had this occur before. She is hemodynamically stable.   PAST MEDICAL HISTORY: 1.  End-stage chronic obstructive pulmonary disease with chronic oxygen use at home, 2 to 3 liters.  2.  Idiopathic dilated cardiomyopathy with ejection fraction of approximately 10% to 15%.  3.  Status post pacemaker placement.  4.  [facility] for shortness of breath and pulmonary edema.  5.  History of hypothyroidism.  6.  Partial left nephrectomy, uncertain etiology.  7.  Total hysterectomy.  8.  Hemorrhoid surgery.   OUTPATIENT MEDICATIONS:  Include   DICTATION ENDS  HERE.  ____________________________ Christena DeemMartin U. Athira Janowicz, MD mus:cs D: 10/30/2013 20:45:00 ET T: 10/30/2013 20:57:25 ET JOB#: [id number]  cc: Christena DeemMartin U. Tsion Inghram, MD, <Dictator> Christena DeemMARTIN U Kyah Buesing MD ELECTRONICALLY SIGNED 11/07/2013 9:39

## 2014-08-16 NOTE — Discharge Summary (Signed)
PATIENT NAME:  [name] [name], [name] [name] MR#:  [id number] DATE OF BIRTH:  [birth year]  DATE OF ADMISSION:  08/29/2013 DATE OF DISCHARGE:  09/04/2013  DISCHARGE DIAGNOSES:  1. Chronic obstructive pulmonary disease exacerbation.  2. Congestive heart failure exacerbation, acute on chronic.  3. Acute on chronic renal failure.   HISTORY OF PRESENT ILLNESS: Please see initial history and physical for details. This is a 79 year old with advanced CHF, EF of 10% as well as COPD who was admitted with shortness of breath, cough. She was found to have likely acute on chronic respiratory failure due to COPD exacerbation.  1. Chronic obstructive pulmonary disease exacerbation. She was treated with azithromycin as well as IV steroids and nebulizers. Clinically, her respiratory status improved.  2. Acute on chronic renal failure due to azotemia. Initially, her diuretics were held. Her BUN was elevated out of proportion to her creatinine but that may have been partially due to some steroids. She clinically was improving and was restarted on her oral torsemide. 3. Hyponatremia. This was stable.   DISCHARGE MEDICATIONS: [facility]. Basically she will be restarted on her torsemide, fluticasone, Spiriva, amiodarone, and levothyroxine. She remains on aspirin and was given a prednisone taper.   DISCHARGE FOLLOWUP: The patient will follow up with Dr. Sampson GoonFitzgerald in 1 to 2 weeks.  DISCHARGE DIET: Low sodium. The patient was discharged home with home health services to resume nursing and physical therapy.   DISCHARGE OXYGEN LEVEL: She takes 2 liters of oxygen by nasal cannula.  This discharge took 40 minutes.    ____________________________ Stann Mainlandavid P. Sampson GoonFitzgerald, MD dpf:lt D: 09/21/2013 18:11:26 ET T: 09/22/2013 05:38:07 ET JOB#: [id number]  cc: Stann Mainlandavid P. Sampson GoonFitzgerald, MD, <Dictator> Taraji Mungo Sampson GoonFITZGERALD MD ELECTRONICALLY SIGNED 09/23/2013 16:30

## 2014-08-16 NOTE — Consult Note (Signed)
Brief Consult Note: Diagnosis: esophageal food impaction.   Patient was seen by consultant.   Consult note dictated.   Recommend to proceed with surgery or procedure.   Discussed with Attending MD.   Comments: Please see full GI consult [phone].  Patient ate a "[place]aw[place]urkey burger" last night, now unable to pass foods or a water challenge.  Will proceed with EGD for removal of probable food impaction. I have discussed wht risks benefits and complications of this proceedure to include not limited to bleeding infection perforation andsedation and she wishes to proceed.  Electronic Signatures: Barnetta ChapelSkulskie, Martin (MD)  (Signed 08-Jul-15 20:57)  Authored: Brief Consult Note   Last Updated: 08-Jul-15 20:57 by Barnetta ChapelSkulskie, Martin (MD)

## 2014-08-16 NOTE — Consult Note (Signed)
PATIENT NAME:  [name] [name], [name] [name] MR#:  [id number] DATE OF BIRTH:  [birth year]  DATE OF CONSULTATION:  09/02/2013  CARDIOLOGY CONSULTATION  REFERRING PHYSICIAN:  Dr. Sampson GoonFitzgerald CONSULTING PHYSICIAN:  [name] MillardAlexander Osie Merkin, MD  PRIMARY CARDIOLOGIST: Gwen PoundsKowalski, MD.   CHIEF COMPLAINT: Shortness of breath.   HISTORY OF PRESENT ILLNESS: The patient is a 79 year old female with known history of end-stage COPD, as well as idiopathic dilated cardiomyopathy who was admitted on 08/29/2013 with increased shortness of breath. The patient likely was experiencing COPD exacerbation, bronchitis with underlying CHF. The patient has been treated with intravenous antibiotics, steroids, breathing treatments. The patient has also been treated with diuretic with modest elevation in creatinine consistent with prerenal azotemia. Troponin was borderline elevated in the absence of chest pain.   PAST MEDICAL HISTORY: 1.  End-stage COPD with home oxygen. 2.  Idiopathic dilated cardiomyopathy.   MEDICATIONS: Torsemide 20 mg daily, amiodarone 200 mg daily, aspirin 325 mg daily, pravastatin 40 mg at bedtime, Flonase 2 sprays daily, albuterol sulfate q.4 p.r.n., Synthroid 100 mcg daily, pravastatin 40 mg at bedtime, Colcrys p.r.n., Symbicort HFA 80/4.5 two puffs b.i.d. and Spiriva  18 mcg daily.   SOCIAL HISTORY: The patient currently lives alone. She continues to intermittently smoke.   FAMILY HISTORY: No immediate family history of myocardial infarction or coronary artery disease.  REVIEW OF SYSTEMS:   CONSTITUTIONAL: No fever or chills.  EYES: No blurry vision.  EARS: No hearing loss.  RESPIRATORY: The patient has shortness of breath.  CARDIOVASCULAR: The patient denies chest pain.  GASTROINTESTINAL: No nausea, vomiting or diarrhea.  GENITOURINARY: No dysuria or hematuria.  ENDOCRINE: No polyuria or polydipsia.  INTEGUMENTARY: No rash.  MUSCULOSKELETAL: No arthralgias or myalgias.  NEUROLOGICAL: No focal muscle  weakness or numbness.  PSYCHOLOGICAL: No depression or anxiety.   PHYSICAL EXAMINATION: VITAL SIGNS: Blood pressure 100/63, pulse 80, respirations 20, temperature 97.6, pulse oximetry 97%.  HEENT: Pupils equal, reactive to light and accommodation.  NECK: Supple without thyromegaly.  LUNGS: Reveal a few crackles in the bases.  CARDIOVASCULAR: Normal JVP. Diffuse PMI. Regular rate and rhythm. Normal S1, S2. No appreciable gallop, murmur or rub.  ABDOMEN: Soft and nontender.  EXTREMITIES: There was trace pedal edema.  MUSCULOSKELETAL: Normal muscle tone.  NEUROLOGIC: The patient is alert and oriented x 3. Motor and sensory both grossly intact.   IMPRESSION: A 79 year old female with end-stage chronic obstructive pulmonary disease who continues to smoke and presents with respiratory failure, which is likely primarily due to chronic obstructive pulmonary disease exacerbation and possible bronchitis. The patient does have idiopathic dilated cardiomyopathy and likely has a possible element of congestive heart failure. The patient has been on diuretic though she is recently experienced increase in her creatinine suggestive of prerenal azotemia.   RECOMMENDATIONS: 1.  Agree with overall current therapy.  2.  Would continue to hold torsemide for now.  3.  Continue to follow BUN and creatinine closely.  4.  No further cardiac diagnostics at this time.   ____________________________ [name] MillardAlexander Brendyn Mclaren, MD ap:ce D: 09/02/2013 17:14:34 ET T: 09/02/2013 18:47:39 ET JOB#: [id number]  cc: [name] MillardAlexander Kyeisha Janowicz, MD, <Dictator> [name] MillardALEXANDER Jozalynn Noyce MD ELECTRONICALLY SIGNED 09/23/2013 15:02

## 2014-08-16 NOTE — Consult Note (Signed)
   General Aspect 79 yo female with history of idopathic cardiomyopathy with ejection fraction of 10%, history of vt treated with a BiV AICD for secondary prevention, history of  hypertensioin,hyperlipidemia and cva admitted with progressive worsening of her shortness of breath.. She states this has been coming on for several days. She states she has been compliant with her medications. CXR in the er revealed cardiomegaly and mild pulmonary edema. EKG revealed ventricular paced rhythm. She has mild troponin elevatoin . She complains of tightness in her neck and shortness of breath. She states she eats a low sodium diet.   Physical Exam:  GEN well developed, well nourished, no acute distress   HEENT PERRL, hearing intact to voice, moist oral mucosa   NECK supple   RESP normal resp effort  no use of accessory muscles  rhonchi  crackles   CARD Regular rate and rhythm  Murmur   Murmur Systolic   Systolic Murmur Out flow   ABD denies tenderness  normal BS   LYMPH negative neck, negative axillae   EXTR negative cyanosis/clubbing, positive edema   SKIN normal to palpation   NEURO cranial nerves intact, motor/sensory function intact   PSYCH alert, anxious   Review of Systems:  Subjective/Chief Complaint shortness of breath   General: Fatigue  Weakness  Trouble sleeping   Skin: No Complaints   ENT: No Complaints   Eyes: No Complaints   Neck: No Complaints   Respiratory: Frequent cough  Short of breath   Cardiovascular: Tightness  Dyspnea  Edema   Gastrointestinal: No Complaints   Genitourinary: No Complaints   Vascular: No Complaints   Musculoskeletal: No Complaints   Neurologic: No Complaints   Hematologic: No Complaints   Endocrine: No Complaints   Psychiatric: Anxiety   Review of Systems: All other systems were reviewed and found to be negative   Medications/Allergies Reviewed Medications/Allergies reviewed   EKG:  Interpretation paced rhythm    PCN:  Hives  Levaquin: Hives  Sulfa drugs: Hives   Impression 79 yo female with history of idiopathic cardiomyopathy admitted with progressive shortness of breath. She appears to have acute on chronic NYHA Class IV systollic heart failure. SHe is on a very aggressive optimal outpatient regimen. Would continue current regimen and agree with iv diuresis with furosamide following renal funciton, electrolytes and clnincal course.   Plan 1. Continue with careful diuresis 2. Remain on amiodarone, spironolactone, pravastatin, and bronchodilators 3. Low sodium diet. 4. Will follow with you   Electronic Signatures: Dalia HeadingFath, Antasia Haider A (MD)  (Signed 11-Feb-15 15:47)  Authored: General Aspect/Present Illness, History and Physical Exam, Review of System, EKG , Allergies, Impression/Plan   Last Updated: 11-Feb-15 15:47 by Dalia HeadingFath, Johnny Latu A (MD)

## 2014-08-16 NOTE — Discharge Summary (Signed)
PATIENT NAME:  [name] [name], [name] [name] MR#:  [id number] DATE OF BIRTH:  Sep 19, 1935  DATE OF ADMISSION:  10/08/2013 DATE OF DISCHARGE:  10/14/2013  DISCHARGE DIAGNOSES: 1.  Congestive heart failure, acute on chronic systolic failure.  2.  Chronic obstructive pulmonary disease exacerbation.  3.  Acute on chronic renal failure.   HISTORY OF PRESENT ILLNESS: Please see initial history and physical for details. Briefly, this is a 79 year old female with known severe cardiomyopathy and COPD, who was admitted with respiratory failure and lower extremity edema. Her BNP was 60,000. Her troponins were borderline positive. Her renal function had worsened.   HOSPITAL COURSE BY ISSUE:  1.  Congestive heart failure. The patient was diuresed. She has a known EF of 10% to 15%. She was continued on her diuretic and will be discharged on it after aggressive diuresis.  2.  Acute on chronic renal failure. Creatinine improved with diuresis.  3.  Chronic obstructive pulmonary disease. Continued on her O2 and nebs.  4. Atrial fibrillation. She remained on her amiodarone.   CODE STATUS: The patient was evaluated by palliative care. She is DNR and will be discharged on home hospice.   DISCHARGE LABORATORY DATA:  Her renal function at discharge showed a creatinine of 1.62, BUN of 43.   DISCHARGE MEDICATIONS: [facility] see [facility] discharge instructions.   DISCHARGE FOLLOWUP:  She will follow up with Dr. Sampson GoonFitzgerald in 1 to 2 weeks.  She will also follow up with cardiology.  DISCHARGE DISPOSITION: Home with home hospice.   This discharge took 40 minutes.   ____________________________ Stann Mainlandavid P. Sampson GoonFitzgerald, MD dpf:dmm D: 10/30/2013 12:21:32 ET T: 10/30/2013 12:49:23 ET JOB#: [id number]  cc: Stann Mainlandavid P. Sampson GoonFitzgerald, MD, <Dictator> Jalaysia Lobb Sampson GoonFITZGERALD MD ELECTRONICALLY SIGNED 11/11/2013 21:09

## 2014-08-16 NOTE — H&P (Signed)
PATIENT NAME:  [name] [name], [name] [name] MR#:  [id number] DATE OF BIRTH:  Nov 18, 1935  DATE OF ADMISSION:  10/30/2013  REFERRING PHYSICIAN: Dr. Marva PandaSkulskie.   PRIMARY CARE PHYSICIAN: Dr. Sampson GoonFitzgerald.   PRIMARY CARDIOLOGIST: Dr. Gwen PoundsKowalski.   CHIEF COMPLAINT: Dysphagia, status post EGD.   HISTORY OF PRESENT ILLNESS: This is a 79 year old female with known history of end-stage chronic obstructive pulmonary disease on baseline oxygen 2 to 3 liters at home, idiopathic dilated cardiomyopathy, EF 10% to 15%, status post pacemaker placement, hypertension, hyperlipidemia and chronic kidney disease, baseline creatinine around 1.6. The patient presents with dysphagia. She reports she was eating a [place] burger this afternoon when she felt like the food stuck in her throat. Reports she tried to have a fruit cup after that to help with her swallowing with no improvement, so she came to the ED where the patient was seen by GI, Dr. Marva PandaSkulskie, in the ED who gave her some liquids to try to drink, where she could not swallow that and she brought it right back up with some of the fruit cup, so the patient went to EGD, where she had EGD done by Dr. Marva PandaSkulskie where she was found to have food in the middle third and the lower third of the esophagus. Removal of the food was successful. There were no acute actual strictures noted, but the patient was noticed to have erosive gastritis and irritation and reflux from the retained food in the lower third of the esophagus and GE junction. As well, she was noticed to have abnormal motility in the esophagus with extra-peristaltic waves of the esophageal body. The patient is currently in the PACU where hospitalists were requested to admit the patient for further observation of the patient overnight status post her procedure given her known cardiac and pulmonary history. The patient was recently hospitalized at [facility] from June 16th to June 22nd due to COPD and CHF exacerbation. The patient was discharged  with hospice care given her end-stage COPD and congestive heart failure. The patient denies any chest pain. Reports her shortness of breath is at baseline where she uses 2 to 3 liters nasal cannula. No nausea. No vomiting. No diarrhea. No constipation. No abdominal pain.   PAST MEDICAL HISTORY:  1. Idiopathic dilated cardiomyopathy with EF 10% to 15%, status post pacemaker placement.  2. End-stage COPD on 2 to 3 liters oxygen at home.  3. Chronic respiratory failure.  4. Chronic kidney disease with baseline creatinine 1.6.  5. History of hypothyroidism.  6. Hypertension.   SOCIAL HISTORY: The patient still smokes 1 to 2 cigarettes per day. No alcohol. No illicit drug use. Currently, she is on hospice care.   FAMILY HISTORY: Father died of prostate cancer. Mother had history of hypothyroidism.   HOME MEDICATIONS:  1. Aspirin 325 mg oral daily.  2. Morphine liquid 5 mg every 4 hours as needed for shortness of breath.  3. Amiodarone 200 mg oral daily.  4. Alprazolam 0.25 mg oral every 8 hours.  5. Spiriva 18 mcg inhalational daily.  6. Torsemide 20 mg oral 2 tablets 2 times a day.  7. Fluticasone 2 sprays daily.  8. Levothyroxine 100 mcg oral daily.   ALLERGIES: LEVAQUIN, PENICILLIN, SULFA DRUGS.   REVIEW OF SYSTEMS:  CONSTITUTIONAL: The patient denies fever, chills, weight gain, weight loss, fatigue, weakness.  EYES: Denies blurry vision, double vision, inflammation.  ENT: Denies tinnitus, ear pain, hearing loss.  RESPIRATORY: Reports history of COPD, baseline shortness of breath. No cough. No hemoptysis.  GASTROINTESTINAL: Denies abdominal pain, diarrhea, constipation, nausea, vomiting. Reports problem with swallowing, started this afternoon.  GENITOURINARY: No dysuria, hematuria or renal colic.  ENDOCRINE: Denies heat or cold intolerance. No polyuria. No polydipsia.  SKIN: No acne, rash or skin lesion.  MUSCULOSKELETAL: No pain or swelling in the joints. No history of gout.   HEMATOLOGY: No easy bruising, bleeding diathesis.  NEUROLOGIC: No new focal deficits, tingling, numbness. No history of CVA, tremors or vertigo.  PSYCHIATRIC: No anxiety, insomnia or bipolar disorder.   PHYSICAL EXAMINATION:  VITAL SIGNS: Heart rate is 80, paced rhythm. Saturating 95% on 2 liters nasal cannula. Temperature 97.7, blood pressure 103/87 on the tele monitor.  GENERAL: Frail, elderly female, lying in bed in no apparent distress. Sleepy in PACU.   HEENT: Head atraumatic, normocephalic. Pupils equal, round and reactive to light. Pink conjunctivae. Anicteric sclerae. Moist oral mucosa. No oral thrush or erythema. On 2 liters nasal cannula.  NECK: Supple. No thyromegaly. No JVD.  CHEST: Fair air entry bilaterally with end-expiratory wheezing. No crackles.  CARDIOVASCULAR: S1, S2 heard. No rubs or gallops. Has pacemaker in the left side of the chest.  ABDOMEN: Soft, nontender, nondistended. Bowel sounds present.  EXTREMITIES: Edema +1 bilaterally. No clubbing. No cyanosis. Pedal and radial pulses felt bilaterally.  PSYCHIATRIC: Appropriate affect. Awake, alert x 3. Intact judgment and insight.  NEUROLOGIC: Cranial nerves grossly intact. Motor 5 out of 5. No focal deficits.  MUSCULOSKELETAL: No joint effusion or erythema.  SKIN: Normal skin turgor. Warm and dry.   PERTINENT LABORATORIES: Glucose 82, BUN 30, creatinine 2.08, sodium 138, potassium 3.9, chloride 96, CO2 33. Troponin 0.06. White blood cells 4.7, hemoglobin 11.9, hematocrit 38.6, platelet 232. Chest x-ray showing generalized cardiomegaly with emphysema. No edema or consolidation noted.   ASSESSMENT AND PLAN:  1. Dysphagia: The patient is status post esophagogastroduodenoscopy with motility disorder noticed. Will be kept n.p.o. overnight and then in the morning will start her on a full liquid diet, advance as tolerated. (Dictation Anomaly) <MISSING TEXT>> and evaluated by gastroenterology, Dr. Marva Panda. She will be admitted  overnight for observation status post her esophagogastroduodenoscopy.   2. Erosive gastritis: Will continue the patient on Protonix 40 mg p.o. b.i.d.  3. Chronic kidney disease: Appears to be slightly worsen than her baseline. Will hold her torsemide for today, especially given her soft blood pressure earlier during the day.  4. End-stage chronic obstructive pulmonary disease: The patient has mild end-expiratory wheezing. Will have her on DuoNebs every 4 hours, p.r.n. albuterol. Will continue her on Spiriva.  5. Idiopathic dilated cardiomyopathy: The patient will be admitted to telemetry unit. She is status post permanent pacemaker. Will hold her diuresis for tonight, but at the same time I will not be starting her on any fluid.  6. Hypothyroidism: Continue Synthroid.  7. Deep vein thrombosis prophylaxis: Subcutaneous heparin.  8. Gastrointestinal prophylaxis: On proton pump inhibitor.   CODE STATUS: The patient confirms she is DNR. She was discharged on home hospice.   TOTAL TIME SPENT ON ADMISSION AND PATIENT CARE: 55 minutes.   ____________________________ Starleen Arms, MD dse:gb D: 10/31/2013 00:13:04 ET T: 10/31/2013 01:12:17 ET JOB#: [id number]  cc: Starleen Arms, MD, <Dictator> Marleen Moret Teena Irani MD ELECTRONICALLY SIGNED 11/01/2013 0:50

## 2014-08-16 NOTE — Discharge Summary (Signed)
PATIENT NAME:  [name] [name], [name] [name] MR#:  [id number] DATE OF BIRTH:  May 30, 1935  DATE OF ADMISSION:  07/26/2013 DATE OF DISCHARGE:  07/28/2013  PRIMARY CARE PHYSICIAN: Stann Mainlandavid P. Sampson GoonFitzgerald, MD  FINAL DIAGNOSES: 1.  Acute respiratory failure secondary to acute on chronic left-sided systolic congestive heart failure.  2.  Acute chronic obstructive pulmonary disease exacerbation.  3.  Hypothyroidism.  4.  Hyperlipidemia.  5.  Vitamin D deficiency.  6.  History of automatic implantable cardiac defibrillator.   HISTORY AND PHYSICAL: Please see dictated admission history and physical.   SUMMARY OF HOSPITAL COURSE: The patient was admitted with acute respiratory failure. She required diuresis, steroids and, fortunately, she improved fairly rapidly with this, which is consistent with her prior admissions. Her BUN level [place] and her diuretics had to be decreased with blood pressure running 90 to 100 systolic, which is approximately baseline for patient. She and she tolerated this without significant symptoms, fortunately.   Physical therapy ambulated the patient and she went 120 feet although she had some balance issues. She continued to require oxygen; she has oxygen in the morning and at night but she will need this 24 hours a day and prescription was written for portable oxygen tank. She should weigh herself daily, calling for more than 2-pound gain in 1 day or 5 pounds in 1 week, or increasing signs or symptoms of heart failure with which she is well familiar. She will follow a 2 gram sodium diet. Physical activity will be up as tolerated with a walker. She will follow up with Dr. Clydie Braunavid Fitzgerald in 1 to 2 weeks. Home health physical therapy and nursing was arranged to monitor oxygenation and improve her balance.   DISCHARGE MEDICATIONS: 1.  Aspirin 325 mg p.o. daily.  2.  Levothyroxine 0.1 mg p.o. daily.  3.  Amiodarone 200 mg p.o. daily.  4.  Pravastatin 40 mg p.o. at bedtime.  5.  Albuterol metered  dose inhaler 1 puff every 4 to 6 hours as needed for wheezing.  6.  Spiriva 1 capsule inhaled daily.  7.  B12 500 mcg p.o. daily.  8.  Vitamin D3 1 capsule p.o. daily.  9.  Advair 250/50 one puff b.i.d.  10.  Torsemide 10 mg 4 tablets once a day as needed for edema or weight gain.  11.  Prednisone taper starting at 50 mg daily, decreasing x 10 mg every 2 days.  12.  Potassium 10 mEq p.o. daily to be used as needed with torsemide.   CODE STATUS: Re-evaluated with the patient. At this point, she wishes to be full code though she would not want to be maintained on life support if it was felt that she would not be able to recover.    ____________________________ Lynnea FerrierBert J. Klein III, MD bjk:cs D: 07/28/2013 11:32:10 ET T: 07/28/2013 15:34:52 ET JOB#: [id number]  cc: Curtis SitesBert J. Klein III, MD, <Dictator> Stann Mainlandavid P. Sampson GoonFitzgerald, MD Daniel NonesBERT KLEIN MD ELECTRONICALLY SIGNED 07/29/2013 12:43

## 2014-08-16 NOTE — H&P (Signed)
PATIENT NAME:  Melecio, Krystn MR#:  [id number] DATE OF BIRTH:  [birth year]  DATE OF ADMISSION:  07/26/2013  PRIMARY CARE PHYSICIAN: Stann Mainlandavid P. Sampson GoonFitzgerald, MD  REFERRING EMERGENCY ROOM PHYSICIAN: Enedina FinnerRandolph N. Manson PasseyBrown, MD  CHIEF COMPLAINT: Shortness of breath.   HISTORY OF PRESENT ILLNESS: The patient is a 79 year old female presenting to the ER with a chief complaint of 2-day history of shortness of breath. Denies any chest pain or pressure. She has cardiomyopathy with ejection fraction less than 10%, status post AICD and pacemaker, according to the old medical records. She is reporting that her shortness of breath is getting worse, and she gained approximately 7 pounds in 3 days. The patient used to weigh 126 pounds, and prior to her arrival to ER, she was weighing 133 pounds. The patient was found to be hypotensive as well. Systolic blood pressure was at the mid 90s during her arrival. The patient's BNP is elevated at 30,000, and troponin is also detectable, but after reviewing the old records, the patient was found to have chronically elevated troponin. The patient was given 10 mg of Lasix IV, but the patient being hypotensive and being in CHF, 1 dose of digoxin was given. The patient also reported that because of the shortness of breath, she could not lie flat. Denies any other complaints. No dizziness or loss of consciousness.   PAST MEDICAL HISTORY: Cardiomegaly, with an ejection fraction of less than 10%, status post AICD pacemaker, systolic congestive heart failure, COPD, tobacco use, hypothyroidism, hyperlipidemia, hypertension, vitamin D deficiency.   PAST SURGICAL HISTORY: Hysterectomy, partial.   ALLERGIES: SHE IS ALLERGIC TO LEVAQUIN, PENICILLIN AND SULFA DRUGS.   PSYCHOSOCIAL HISTORY: Lives at home. She used to smoke, but cut down the smoking, currently smoking 1 cigarette once in a while. Denies any alcohol or illicit drug usage.   FAMILY HISTORY: Prostate cancer runs in her family.    HOME MEDICATIONS:  1. Vitamin D3 one capsule p.o. once daily.  2. Vitamin B12 500 mcg p.o. once daily.  3. Torsemide 10 mg 2 tablets p.o. once daily. 4. Symbicort 80 mcg 1 puff inhalation 2 times a day. 5. Spiriva 18 mcg 1 capsule inhalation once daily. 6. Pravastatin 40 mg p.o. once daily.  7. Levothyroxine 100 mcg p.o. once daily.  8. Aspirin 325 mg p.o. once daily. 9. Amiodarone 200 mg p.o. once daily.  10. Albuterol 1 puff inhalation every 4 to 6 hours as needed. 11. Advair 250/50 one puff inhalation 2 times a day.   REVIEW OF SYSTEMS:  CONSTITUTIONAL: Denies any fever, but complaining of fatigue, 7 pounds of weight gain in 3 days.  EYES: Denies blurry vision, double vision. ENT: Denies epistaxis or discharge.  RESPIRATION: Complaining of shortness of breath. Denies any hemoptysis. Has chronic history of COPD.  CARDIOVASCULAR: No chest pain or palpitations.  GASTROINTESTINAL: Denies nausea, vomiting, diarrhea.  GENITOURINARY: No dysuria, hematuria.  GYNECOLOGIC AND BREASTS: Denies breast mass or vaginal discharge. Is status post partial hysterectomy.  ENDOCRINE: Denies polyuria or nocturia. Has hypothyroidism.  NEUROLOGIC: Denies any vertigo or ataxia.  PSYCHIATRIC: No ADD, OCD.  MUSCULOSKELETAL: No joint pains. Denies any gout.   PHYSICAL EXAMINATION:  VITAL SIGNS: Temperature 97.9, pulse 75, respirations 20, blood pressure 101/62, pulse oximetry 98%.  GENERAL APPEARANCE: Not under acute distress. Moderately built and thin-looking female.  HEENT: Normocephalic, atraumatic. Pupils are equally reactive to light and accommodation. No scleral icterus. No conjunctival injection. No sinus tenderness. Positive JVD.  NECK: Supple. No thyromegaly.  LUNGS: With  rales and rhonchi. Moderate air entry.  CARDIOVASCULAR: S1 and S2 normal, regular rate and paced rhythm.  GASTROINTESTINAL: Soft. Bowel sounds are positive in all 4 quadrants. Nontender, nondistended. No hepatosplenomegaly. No  masses felt.  NEUROLOGIC: Awake, alert and oriented x3. Motor and sensory grossly intact. Reflexes are 2+.  EXTREMITIES: 2+ pitting edema is present. No cyanosis. No clubbing.  SKIN: Warm to touch. Normal turgor. No rashes. No lesions.  MUSCULOSKELETAL: No joint effusion, tenderness or erythema.  PSYCHIATRIC: Normal mood and affect.   LABORATORY AND IMAGING STUDIES: Glucose 120. BNP 33,277. According to the old records, her BNP was fluctuating between 15,000 to 60,000. BUN 46, creatinine 2.08. Her baseline seemed to be at 2.0. Sodium 138, potassium 4.3, chloride 101, CO2 30, anion gap 7, GFR 23, serum osmolality 289, calcium 8.7. Troponin 0.11. WBC 6.9, hemoglobin 11.5, hematocrit 36.1, platelets are 132, MCV 94. Diagnostic chest x-ray, portable view, has revealed small bilateral pleural effusion, bibasilar airspace opacities, likely reflecting atelectasis, though mild interstitial edema could have a similar appearance. [place]ling cardiomegaly is noted. A 12-lead EKG has revealed paced rhythm.   ASSESSMENT AND PLAN: A 79 year old female presenting to the ER with a chief complaint of 2-day history of shortness of breath. Will be admitted with the following assessment and plan.   1. Acute exacerbation of systolic congestive heart failure and cardiomyopathy with hypotension. Will admit her to stepdown unit. A small dose of Lasix 10 mg IV was given in the ER. One dose of digoxin IV was given in view of hypotension. Cardiology consult is placed to [facility].  2. Elevated troponin, could be from demand ischemia, but the patient chronically has elevated troponin at around 0.42 to 0.10 range.  3. Chronic kidney disease. The patient has chronic renal insufficiency with a baseline creatinine at 2.1.  4. History of chronic obstructive pulmonary disease, not under exacerbation. Will provide her nebulizer treatments.  5. Hypertension and hyperlipidemia. Resume home medication.  6. Hypothyroidism.  Continue Synthroid.  7. Will provide gastrointestinal and deep vein thrombosis prophylaxis.   CODE STATUS: She is DNR. Son is the medical power of attorney.   Plan of care discussed with the patient. Transfer the patient to Dr. Sampson Goon in a.m.   TOTAL TIME SPENT ON ADMISSION: 50 minutes.    ____________________________ Ramonita Lab, MD ag:lb D: 07/26/2013 08:02:11 ET T: 07/26/2013 08:35:29 ET JOB#: [id number]  cc: Ramonita Lab, MD, <Dictator> Stann Mainland. Sampson Goon, MD Ramonita Lab MD ELECTRONICALLY SIGNED 08/01/2013 4:46

## 2014-08-16 NOTE — H&P (Signed)
PATIENT NAME:  [name] [name], [name] [name] MR#:  [id number] DATE OF BIRTH:  [birth year]  DATE OF ADMISSION:  01/13/2014  PRIMARY CARE PHYSICIAN:  Clydie Braunavid Fitzgerald, MD   CHIEF COMPLAINT: Fall with pain in the right side of the head.   HISTORY OF PRESENT ILLNESS: Ms. [name] [name] is a 79 year old Caucasian female with past medical history of   DICTATION ENDS HERE  already done another dictation   ____________________________ Wylie HailSona A. Allena KatzPatel, MD sap:lr D: 01/13/2014 17:19:23 ET T: 01/13/2014 18:49:29 ET JOB#: [id number]  cc: Kimberlea Schlag A. Allena KatzPatel, MD, <Dictator> Stann Mainlandavid P. Sampson GoonFitzgerald, MD Willow OraSONA A Antonique Langford MD ELECTRONICALLY SIGNED 01/16/2014 15:21

## 2014-08-16 NOTE — H&P (Signed)
PATIENT NAME:  [name] [name], [name] [name] MR#:  [id number] DATE OF BIRTH:  10-02-1935  DATE OF ADMISSION:  06/05/2013  PRIMARY CARE PHYSICIAN: Dr. Sampson GoonFitzgerald.   PRIMARY CARDIOLOGIST: Dr. Gwen PoundsKowalski.  PRIMARY PULMONOLOGIST: Dr. Meredeth IdeFleming.   CHIEF COMPLAINT: Shortness of breath and dyspnea on exertion.   A very pleasant 79 year old female with cardiomyopathy, EF of less than 10% status post AICD and pacemaker, who presents with the above complaint. Over the past 3 to 4 days, the patient has had increasing shortness of breath, dyspnea on exertion, PND, orthopnea, and minimal lower extremity edema. She had some minor chest pain yesterday. She felt that her lungs were pulling out of her shoulder blades. She is not sleeping very well due to shortness of breath. She also has a nonproductive cough for the same amount of time.   REVIEW OF SYSTEMS:  CONSTITUTIONAL: No fever. Positive fatigue. No weakness, weight loss or gain.  EYES: No blurred or double vision  ENT: No ear pain, hearing loss, sneezing, seasonal allergies, postnasal drip.  RESPIRATORY: Positive cough, positive wheezing. Positive COPD, not on oxygen. No hemoptysis. Positive dyspnea.  CARDIOVASCULAR: Mild chest pain two days ago. No palpitations, orthopnea, syncope, arrhythmia. Positive dyspnea on exertion. GASTROINTESTINAL: No nausea, vomiting, diarrhea, abdominal pain, melena or ulcers. GENITOURINARY: No dysuria, hematuria. ENDOCRINE: No polyuria or polydipsia.  HEMATOLOGIC AND LYMPHATIC: No anemia, easy bruising.  SKIN: No rash or lesions.  MUSCULOSKELETAL: no pain in the shoulders NEUROLOGICAL: Positive history of CVA.  PSYCHIATRIC: No history of anxiety or depression.   PAST MEDICAL HISTORY: 1. Cardiomyopathy, ejection fraction of less than 10% status post AICD pacemaker.  2. COPD, not on oxygen.  3. Tobacco abuse.  4. Hypothyroidism.  5. Hyperlipidemia.  6. Hypertension.  7. Vitamin D deficiency.   ALLERGIES: LEVAQUIN, PENICILLIN AND  SULFA.   SOCIAL HISTORY: The patient smokes one cigarette a day, does not want to quit. She has been smoking since age 79. No drug or IV drug use. No alcohol use.   FAMILY HISTORY: Positive for prostate cancer.   MEDICATIONS: 1. Albuterol 4 - 6 hours p.r.n.  2. Symbicort 1 puff b.i.d.  3.   mg daily.  4. Aspirin 325 mg daily.  5. Calcium carbonate 700 mg daily.  6. Torsemide 20 mg daily.  7. Synthroid 100 mcg daily.  8. Pravastatin 40 mg daily.  9. Spiriva 18 mcg daily.  10. Vitamin B12 500 mcg daily.  11. Vitamin D3 1 tablet daily.   PHYSICAL EXAMINATION: VITAL SIGNS: Temperature is 97.8, pulse 74, respirations 22, blood pressure 119/56, 95% on 2 liters, 88% on room air.  GENERAL: The patient is alert no acute distress. HEENT: Head is atraumatic. Pupils are round, Sclerae anicteric, mucous membranes are moist.  OROPHARYNX: Clear.  NECK: Supple. There is positive JVD up to the line of the jaw. No enlarged thyroid. CARDIOVASCULAR: Distant heart sounds. No murmurs, gallops, or rubs. The patient's rhythm is paced. LUNGS: There is decreased breath sounds at the bases. She has crackles at the left base about a third way up, no dullness to percussion. Normal chest expansion.   positive, nontender and nondistended. No hepatosplenomegaly.  EXTREMITIES: No clubbing, cyanosis or edema.  NEUROLOGICAL: Cranial nerves II through XII are grossly intact. No focal deficits.  MUSCULOSKELETAL: 5/5 strength in all extremities. SKIN:  Without rashes or lesions.  BACK: No CVA or vertebral tenderness.   White blood cells 4.7, hemoglobin 12.3, hematocrit 39, platelets 154. Sodium 139, potassium 4.2, chloride 103, bicarbonate 31, BUN 27,  creatinine 1.50, glucose 92, calcium 9.1, bilirubin 0.4, alkaline phosphatase 84, ALT 25, AST 22, total protein 7.3, albumin 3.3, troponin 0.06. BNP 14103.   Chest x-ray is compatible with pulmonary edema.   EKG: Ventricular paced rhythm.   ASSESSMENT AND PLAN: A  79 year old female with severe cardiomyopathy, ejection fraction of less than 10% status post automatic implantable cardiac defibrillator pacemaker, who presents with shortness of breath. Acute respiratory failure secondary to acute on chronic systolic heart failure. Plan as outlined below.  1. Acute on chronic systolic heart failure with ejection fraction of less than 10% status post automatic implantable cardiac defibrillator. The patient will be admitted to telemetry. We will continue monitoring I's and O's, daily weights. Lasix 40 IV q.8h. We will follow creatinine and I's and O's carefully. We will continue amiodarone.  2. Bronchitis. The patient had nonproductive cough for a few days. She probably has underlying bronchitis. I started azithromycin 250 mg daily, which can be treated for five days.  3. Chronic obstructive pulmonary disease. This seems to be stable, without any wheezing, although the patient does report some wheezing at home, likely secondary to her congestive heart failure. We will continue inhalers and DuoNebs. At this time,  I will not add any steroids unless her breathing becomes worse. She has evidence of a chronic obstructive pulmonary disease exacerbation.  4. Hyperlipidemia. We will continue statin.  5. Hypothyroidism. Continue Synthroid.   The patient is a DO NOT RESUSCITATE status.    TIME SPENT: Approximately 50 minutes.     ____________________________ Janyth Contes. Juliene Pina, MD spm:sg D: 06/05/2013 09:10:00 ET T: 06/05/2013 09:29:02 ET JOB#: [id number]  cc: Zaivion Kundrat P. Juliene Pina, MD, <Dictator> Stann Mainland. Sampson Goon, MD  Janyth Contes George Haggart MD ELECTRONICALLY SIGNED 06/05/2013 14:36

## 2014-08-16 NOTE — Consult Note (Signed)
   Comments   I met with pt's son to update him on pt's current medical status including A/CKD. He understands that pt cannot return home without 24/7 assistance which he cannot provide. He also understands that pt is likely approaching the end of life. He asked that I speak with his mother about [facility] to [facility]. Son and I met with pt. Talked with her about transfer to the [facility]. Though pt is resistant to this idea, she seems to understand that going back to her home is not an option.  Ward, RN, liason for the [facility] aware and will see pt. Hospice Home orders updated.   Electronic Signatures: Hatley Henegar, Izora Gala (MD)  (Signed 22-Sep-15 13:57)  Authored: Palliative Care   Last Updated: 22-Sep-15 13:57 by Kaleigha Chamberlin, Izora Gala (MD)

## 2014-08-16 NOTE — H&P (Signed)
ADDENDUM  PATIENT NAME:  [name] [name], [name] [name] MR#:  [id number] DATE OF BIRTH:  11-Dec-1935  DATE OF ADMISSION:  06/05/2013  The patient is a smoker. I counseled the patient regarding smoking. She still smokes 1 cigarette a day. She does not want to quit. She has been smoking since the age of 79. She does state she has cut down quite a bit, so I did congratulate her for that, but we do encourage her to even that 1 cigarette to stop smoking completely. The patient was counseled about 4 minutes.   ____________________________ Janyth ContesSital P. Juliene PinaMody, MD spm:aw D: 06/05/2013 09:12:58 ET T: 06/05/2013 09:24:53 ET JOB#: [id number]  cc: Syliva Mee P. Juliene PinaMody, MD, <Dictator> Janyth ContesSITAL P Rhapsody Wolven MD ELECTRONICALLY SIGNED 06/05/2013 14:35

## 2014-08-16 NOTE — Consult Note (Signed)
PATIENT NAME:  [name] [name] MR#:  [id number] DATE OF BIRTH:  Feb 03, 1936  DATE OF CONSULTATION:  10/30/2013  REFERRING PHYSICIAN:   CONSULTING PHYSICIAN:  Christena DeemMartin U. Skulskie, MD  ADDENDUM:  THIS IS A CONTINUATION   OUTPATIENT MEDICATIONS: Include alprazolam 0.25 mg tablet every 8 hours p.r.n. for anxiety, amiodarone 200 mg once a day, 325 mg aspirin once daily, fluticasone 2 sprays nasally daily, levothyroxine 100 mcg daily, Roxanol/morphine 20 mg per oral concentrate. She states that she has only taken this a couple of times at home. Oxygen 2 liters nasal cannula "p.r.n." Spiriva 18 mcg inhalation capsule once daily, torsemide 20 mg 2 tablets twice a day. She also takes occasional Advair, occasional Symbicort, and occasional fluticasone.   ALLERGIES: SHE IS ALLERGIC TO LEVAQUIN, PENICILLIN, AND SULFA DRUGS.   SOCIAL HISTORY: Smoker. Lives alone at home. Does not use alcohol.   GASTROINTESTINAL FAMILY HISTORY: Noncontributory.   PAST MEDICAL HISTORY:  Congestive heart failure and hypertension.   PHYSICAL EXAMINATION:  VITAL SIGNS: Temperature 97.7, pulse 72, respirations 18, blood pressure 92/56, pulse oximetry 95%.  GENERAL: She is a 79 year old Caucasian female, no acute distress.  HEENT: Normocephalic, atraumatic.  EYES: Anicteric.  NOSE: Septum midline.  OROPHARYNX: She has false teeth.  NECK: Supple. No JVD.  HEART: Regular rate and rhythm.  LUNGS: Lung show some mild crackles in the left base, some bilateral wheezes, mild again.  ABDOMEN: Soft, slightly protuberant.  Nontender, nondistended. Bowel sounds are positive, normoactive.  RECTAL: Anorectal exam deferred.  EXTREMITIES: No clubbing, cyanosis, or edema.  NEUROLOGICAL: Cranial nerves II through XII grossly intact. Muscle strength bilaterally equal and symmetric.  LABORATORY DATA:  She had a glucose of 82, BUN 30, creatinine 2.08, sodium 138, potassium 3.9, chloride 96, bicarbonate 33, calcium 8.6. Troponin I x 1,  slightly elevated at 0.06. Hemogram showing a white count of 4.7, H and H 11.9 and 38.6, platelet count of 232,000. MCV is 92.   RADIOLOGICAL DATA:  There was a PA and lateral chest film showing cardiomegaly, underlying emphysematous changes. No edema or consolidation. Pulmonary, vascular was noted to be within normal limits. Pacemaker leads were attached to the right heart.  Degenerative changes in the thoracic spine.   ASSESSMENT: Esophageal food impaction as noted above. The patient is handling secretions so some liquids at least are getting through. However, when she did bring up some material on water challenge, this also included contents of a fruit cup that was eaten earlier this morning indicating that quite likely there is an obstruction.   RECOMMENDATION:  EGD with anesthesia assistance for esophageal evaluation and removal of food bolus. I have discussed the risks, benefits, and complications of this procedure to include, but not limited to bleeding, infection, perforation, and the risk of sedation and she wishes to proceed. I carefully explained that this is a higher risk procedure for her because of her pulmonary and heart conditions. I have also discussed this with anesthesiology.    ____________________________ Christena DeemMartin U. Skulskie, MD mus:dd D: 10/30/2013 20:52:28 ET T: 10/30/2013 21:46:55 ET JOB#: [id number]  cc: Christena DeemMartin U. Skulskie, MD, <Dictator> Christena DeemMARTIN U SKULSKIE MD ELECTRONICALLY SIGNED 11/07/2013 9:39

## 2014-08-16 NOTE — H&P (Signed)
PATIENT NAME:  [name] [name], [name] [name] MR#:  [id number] DATE OF BIRTH:  1935-08-20  DATE OF ADMISSION:  10/08/2013  PRIMARY CARE PHYSICIAN: Stann Mainland. Sampson Goon, MD, with Jonathon Bellows.   PRIMARY CARDIOLOGIST: Lamar Blinks, MD, with Orpah Clinton EMERGENCY ROOM PHYSICIAN: Humberto Leep. Margarita Grizzle, MD  CHIEF COMPLAINT: Shortness of breath.   HISTORY OF PRESENTING ILLNESS: This is a 79 year old female with past history of end-stage chronic obstructive pulmonary disease and chronic oxygen use, 2 to 3 liters at home, and idiopathic dilated cardiomyopathy, status post pacemaker placement, who presented to Emergency Room with complaint of 2 days of cough, no sputum production, with increased leg swelling and feeling short of breath with any exertion. In ER, on chest x-ray, noticed to be having some pulmonary edema, and she was given respiratory treatment and Lasix, but elevated BNP and still not feeling comfortable on exertion, so given to hospitalist team for admission.   REVIEW OF SYSTEMS:  CONSTITUTIONAL: Negative for fever, fatigue, weakness, pain or weight loss.  EYES: No blurring or double vision, discharge or redness.  ENT: No tinnitus, ear pain or hearing loss.  RESPIRATORY: The patient has some cough, but no sputum production. No wheezing. Mild shortness of breath.  CARDIOVASCULAR: No chest pain, orthopnea. Has some edema of the legs. No arrhythmia or palpitations, and she chronically sleeps in sitting position on recliner, and no change recently in that.  GASTROINTESTINAL: No nausea, vomiting, diarrhea, abdominal pain.  GENITOURINARY: No dysuria, hematuria or increased frequency.  ENDOCRINE: No heat or cold intolerance. No excessive sweating.  SKIN: No acne, rashes or lesions.  MUSCULOSKELETAL: No pain or swelling in the joints.  NEUROLOGICAL: No numbness, weakness, tremor or vertigo.  PSYCHIATRIC: No anxiety, insomnia, bipolar disorder.  JOINTS: No swelling or tenderness.   PAST MEDICAL HISTORY:  1.  End-stage COPD, with 2 to 3 liters oxygen use at home chronically.  2. Idiopathic dilated cardiomyopathy, status post pacemaker placement.   SOCIAL HISTORY: Smokes. No alcohol use. Lives at home alone.   FAMILY HISTORY: Father died of prostate cancer. Mother with hypothyroidism.   MEDICATIONS: As per the records from last time, still need to be confirmed by pharmacist:  1. Torsemide 10 mg oral tablet 2 tablets once a day.  2. Spiriva 18 mcg inhalation capsule once a day.  3. Prednisone 20 mg oral tablet 3 tablets b.i.d. for 3 days.  4. Levothyroxine 100 mcg oral once a day.  5. Fluticasone nasal 2 sprays once a day.  6. Aspirin 325 mg oral tablet once a day.  7. Amiodarone 200 mg oral once a day.   PHYSICAL EXAMINATION:  VITAL SIGNS: In ER, temperature 98.3, pulse 83, respirations 22, blood pressure 102/57 and pulse oximetry is 99% on oxygen supplementation.  GENERAL: The patient is fully alert and oriented to time, place and person. Does not appear in any acute distress.  HEENT: Head and neck atraumatic. Conjunctivae pink. Oral mucosa moist.  NECK: Supple. No JVD.  RESPIRATORY: Bilateral equal air entry. No wheezing. There is some crepitation present, bilateral lower lobes.  CARDIOVASCULAR: S1, S2 present. Pacemaker present on left chest wall. Regular rhythm. No murmur.  ABDOMEN: Soft, nontender. Bowel sounds present. No organomegaly.  SKIN: No rashes.  LEGS: There is mild edema present on bilateral legs and ankles.  JOINTS: No swelling or tenderness.  NEUROLOGICAL: No numbness, weakness. Power 5 out of 5. Follows commands. No tremors.  PSYCHIATRIC: No anxiety, insomnia. Does not appear in any acute psychiatric illness.   IMPORTANT LABORATORY  RESULTS: BNP is 59,741. Glucose 113, BUN is 57, creatinine 2.23, sodium 138, potassium is 3.8, chloride is 101 and CO2 is 28. Troponin 0.07. WBC 9.6, hemoglobin 11.3 and platelet count is 180. MCV is 92. Chest x-ray, portable, single view, shows  appearance of chest suggestive of mild congestive heart failure, atherosclerosis. EKG reviewed, shows paced rhythm.   ASSESSMENT AND PLAN: A 79 year old female with end-stage chronic obstructive pulmonary disease, on chronic oxygen use at home, with idiopathic dilated cardiomyopathy and status post pacemaker placement, who came to Emergency Room with complaint of worsening shortness of breath for the last 2 days and found having heart failure.   1. Congestive heart failure, acute systolic heart failure likely. We do not have any echocardiogram in system; will get 1 echo. Will give Lasix IV b.i.d. with low-salt diet and will keep her on input and output monitoring to check the success of treatment. Continue oxygen delivery. Will also continue aspirin and will get cardiology consult for further evaluation. Because of borderline blood pressure, not a good candidate for other medications, and I will let cardiologist and her primary care handle this issue.  2. End-stage chronic obstructive pulmonary disease with chronic oxygen use at home. Currently, there is no active wheezing, so will just continue her inhalers, and will give DuoNeb nebulizer treatment as-needed basis. No need for IV steroid or antibiotics at this time.  3. Chronic renal failure, this appears to be slightly worsened. Will continue monitoring. Will have to give her IV Lasix for her congestive heart failure  4. Hypothyroidism. The patient is taking levothyroxine. Will continue it at this time.  5. Tobacco abuse. Smoking cessation counseling done for 4 minutes. The patient is trying to quit now, but she would not like to have any nicotine substitutes in the hospital. She is okay without that.   TOTAL TIME SPENT ON THIS ADMISSION: 45 minutes.   ____________________________ Hope PigeonVaibhavkumar G. Elisabeth PigeonVachhani, MD vgv:lb D: 10/08/2013 10:26:40 ET T: 10/08/2013 10:47:49 ET JOB#: [id number]  cc: Hope PigeonVaibhavkumar G. Elisabeth PigeonVachhani, MD, <Dictator> Stann Mainlandavid P.  Sampson GoonFitzgerald, MD Lamar BlinksBruce J. Kowalski, MD Heath GoldVAIBHAVKUMAR [facility] CityVACHHANI MD ELECTRONICALLY SIGNED 10/09/2013 9:10

## 2014-08-16 NOTE — Consult Note (Signed)
PATIENT NAME:  [name] [name], [name] [name] MR#:  [id number] DATE OF BIRTH:  November 07, 1935  DATE OF CONSULTATION:  10/08/2013  REFERRING PHYSICIAN:  Dr. Percell BostonV. CONSULTING PHYSICIAN:  Lamar BlinksBruce J. Kowalski, MD  Arnoldo HookerBruce Kowalski gross myringotomy [id number].  REASON FOR CONSULTATION: Acute on chronic systolic dysfunction, congestive heart failure with elevated troponin.   CHIEF COMPLAINT: "I have shortness of breath."   HISTORY OF PRESENT ILLNESS: This is an elderly female with known severe dilated cardiomyopathy with chronic kidney disease, hypertension, and hyperlipidemia with valvular heart disease, status post previous intervention, on appropriate medication management, who has had new onset of shortness of breath, weakness and fatigue, severe PND and orthopnea over the last 2 days with lower extremity edema and weight gain, now with a chest x-ray and hypoxia consistent with acute on chronic systolic dysfunction and congestive heart failure. She has received Lasix with slight improvement in oxygenation. Troponin is 0.07, consistent with demand ischemia rather than acute myocardial infarction, and she does have chronic kidney disease with a creatinine of 2.2 with what appears to be cardiorenal syndrome.   REVIEW OF SYSTEMS: From appearance of remainder of review of systems, negative for vision change, ringing in the ears, hearing loss, cough, heartburn, nausea, vomiting, diarrhea, bloody stools, stomach pain, extremity pain, leg weakness, cramping of the buttocks, known blood clots, headaches, blackouts, dizzy spells, nosebleeds, congestion, trouble swallowing, frequent urination, urination at night, muscle weakness, numbness, anxiety, depression, skin lesions, skin rashes.   PAST MEDICAL HISTORY:  1.  Dilated cardiomyopathy.  2.  Hypertension.  3.  Chronic kidney disease.   SOCIAL HISTORY: Currently denies alcohol or tobacco use.   ALLERGIES: AS LISTED.   MEDICATIONS: As listed.   PHYSICAL EXAMINATION: VITAL SIGNS:  Blood pressure is 110/64 bilaterally. Heart rate 70, upright, reclining, and regular.  GENERAL: She is a well-appearing female in some shortness of breath, acute distress.  HEAD, EYES, EARS, NOSE, THROAT: No icterus, thyromegaly, ulcers, hemorrhage, or xanthelasma.  CARDIOVASCULAR: Regular rate and rhythm, normal S1 and S2,  III/VI apical murmur consistent with mitral regurgitation. There is an RV heave. The patient does have an S3, 4 gallop. PMI is diffuse. Carotid upstroke normal without bruit. Jugular venous pressure is normal.  LUNGS: Have severe bibasilar crackles with decreased breath sounds.  ABDOMEN: Soft, nontender with some hepatosplenomegaly but no masses.  EXTREMITIES: Show 2+ radial, trace femoral, no dorsal pedal pulses, with 1+ lower extremity edema. No cyanosis, clubbing, or ulcers.  NEUROLOGIC: She is oriented to time, place, and person, with normal mood and affect.   ASSESSMENT: Elderly female with severe dilated cardiomyopathy, valvular heart disease with acute on chronic systolic dysfunction, congestive heart failure with demand ischemia and cardiorenal syndrome.   RECOMMENDATIONS: 1.  Gentle diuresis with intravenous Lasix, watching for worsening cardiorenal syndrome. 2.  Continue carvedilol as able and abstain from ACE inhibitor due to chronic kidney disease.  3.  No further intervention with echocardiogram due to the patient recently had an echocardiogram showing ejection fraction of 15%.  4.  Serial ECG and enzymes to assess for possible myocardial infarction.  5.  Further diagnostic testing and adjustment of medications as necessary after above.   ____________________________ Lamar BlinksBruce J. Kowalski, MD bjk:jcm D: 10/08/2013 13:30:58 ET T: 10/08/2013 14:47:40 ET JOB#: [id number]  cc: Lamar BlinksBruce J. Kowalski, MD, <Dictator> Lamar BlinksBRUCE J KOWALSKI MD ELECTRONICALLY SIGNED 10/09/2013 17:44

## 2014-08-16 NOTE — Discharge Summary (Signed)
PATIENT NAME:  [name] [name], [name] [name] MR#:  [id number] DATE OF BIRTH:  [birth year]  DATE OF ADMISSION:  06/05/2013 DATE OF DISCHARGE:  06/10/2013  DISCHARGE DIAGNOSES:  1. Congestive heart failure, acute on chronic systolic exacerbation.  2. Acute renal failure.  3. Chronic obstructive pulmonary disease exacerbation.  4. Hypertension.   HISTORY OF PRESENT ILLNESS: Please see admission history and physical for details. Briefly, this is a 79 year old with severe CHF, admitted with a CHF exacerbation as well as a COPD exacerbation.   HOSPITAL COURSE BY ISSUE:  1. Systolic congestive heart failure. This improved with diuresis. She will be discharged on her maintenance medications.  2. Hypotension. Her baseline blood pressure is quite low. She remains asymptomatic with this and was able to tolerate diuresis.  3. Acute on chronic renal failure. This was likely due to overdiuresis. Nephrology consult was seen. Creatinine improved as diuresis was slowed.  4. Chronic obstructive pulmonary disease. The patient was treated with IV steroids and nebulizers. She will be discharged on oral prednisone.   DISCHARGE MEDICATIONS: Please see [facility] discharge instructions.   DISCHARGE FOLLOWUP: The patient will follow up with Dr. Sampson GoonFitzgerald on Friday at 12:15 p.m.   DISCHARGE DIET: Low sodium, regular consistency.   DISCHARGE OXYGEN: 2 liters.   DISPOSITION: Discharged to home.   ACTIVITY: As tolerated.   TIME SPENT: This discharge took 35 minutes.    ____________________________ Stann Mainlandavid P. Sampson GoonFitzgerald, MD dpf:lb D: 06/12/2013 12:32:28 ET T: 06/12/2013 12:54:15 ET JOB#: [id number]  cc: Stann Mainlandavid P. Sampson GoonFitzgerald, MD, <Dictator> Gerald Honea Sampson GoonFITZGERALD MD ELECTRONICALLY SIGNED 06/19/2013 23:20

## 2014-08-16 NOTE — H&P (Signed)
PATIENT NAME:  [name] [name] MR#:  [id number] DATE OF BIRTH:  Mar 07, 1936  DATE OF ADMISSION:  08/29/2013  HISTORY OF PRESENT ILLNESS:  A [age] year old female presents with respiratory failure. She is on chronic O2 at 3 liters at night and O2 at 2 liters nasal cannula during the day. For the  last 3 or 4 days, she notes a progressive dyspnea on exertion to the point that she really cannot stay home anymore. She came in with near intubation but with BiPAP  things settled down. With steroids she got a little bit better. Chest x-ray shows no infiltrate. She has not had any productive sputum. No fever or chills. She says her COPD has been worsening month by month, particularly in the last couple of years. She will be admitted for further evaluation and treatment.   PAST MEDICAL HISTORY: 1. End-stage chronic obstructive pulmonary disease, O2 dependent.  2. Idiopathic dilated cardiomyopathy.   PAST SURGICAL HISTORY: Hysterectomy, hemorrhoidectomy,  and tonsillectomy.   MEDICATIONS: Torsemide 20 mg daily, Flonase 2 sprays daily, amiodarone 200 mg daily, albuterol sulfate q.4 p.r.n., Synthroid 100 mcg daily, aspirin 325 mg daily, pravastatin 40 mg at bedtime. Colcrys p.r.n., Symbicort HFA 80/4.5 at 2 puffs b.i.d., and Spiriva 18 mcg daily.   ALLERGIES: FLONASE, LEVAQUIN, PENICILLIN, and SULFA.   SOCIAL HISTORY: Smoke. No alcohol.  Lives alone.  FAMILY HISTORY: Father died of prostate cancer. Mother with hypothyroidism.   REVIEW OF SYSTEM: Otherwise negative.   PHYSICAL EXAMINATION:  VITAL SIGNS: Blood pressure 135/75, pulse 80 and regular.  NECK: No thyromegaly or bruits.  LUNGS: Diffuse inspiratory/expiratory wheezing. No clear consolidation in all fields.  HEART: Regular rhythm. No MRG.  ABDOMEN: Soft and nontender.  EXTREMITIES: 1+ edema, 1+ trace pedal pulses.   NEUROLOGIC: Grossly nonfocal.   LABORATORY DATA: Creatinine 2.1.   IMAGING DATA: Chest x-ray shows no infiltrate, mild  CHF.  ASSESSMENT AND PLAN: 1. Respiratory failure - aggressive pulmonary toilet with steroids, antibiotics, recognizing her creatinine of 2.0. Will follow renal function closely.  2. Renal dysfunction - follow closely.  3. Cardiomyopathy - compensated.  OVERALL PROGNOSIS: Guarded.   Continue BiPAP therapy and wean to O2.    _____________________ Danella PentonMark F. Yaire Kreher, MD mfm:dw D: 08/29/2013 18:36:00 ET T: 08/29/2013 20:06:17 ET JOB#: [id number]  cc: Danella PentonMark F. Geovonni Meyerhoff, MD, <Dictator> Kynan Peasley Sherlene ShamsF Doriana Mazurkiewicz MD ELECTRONICALLY SIGNED 08/30/2013 7:53

## 2014-08-16 NOTE — H&P (Signed)
PATIENT NAME:  [name] [name], SCHNAKE MR#:  [id number] DATE OF BIRTH:  [birth year]  ADDENDUM:  This is a continuation.  Ms. [name] [name] is a very pleasant 79 year old Caucasian female with past medical history of chronic anemia,severe congestive heart failure with cardiomyopathy, history of CKD stage 3-4, along with history of GERD, who is under the [facility]. Comes to the Emergency Room after she had sustained a fall where she tripped on her bathroom slippers.  The patient says she hit her right side of the head on something, started having pain. She could not get up until the hospice nurse went to check on her, called the EMS and came to the Emergency Room. CT of the head shows intracranial lenticular tiny petechial hemorrhages and a scalp hematoma on the right side. The patient is neurologically intact. She is being admitted for further evaluation and management. She is not safe to return home at present.   CODE STATUS:  No code, DO NOT RESUSCITATE.  PAST MEDICAL HISTORY:  1.  Severe cardiomyopathy with EF of around 15%, idiopathic. She is status post pacemaker.  2.  End-stage COPD on 2-3 liters home oxygen.  3.  Chronic respiratory failure.  4.  Chronic kidney disease, baseline creatinine around 1.6.  5.  History of hypothyroidism.  6.  Hypertension.   SOCIAL HISTORY: She still smokes about 1 to 2 cigarettes per day. No alcohol, no illicit drug use, currently on the hospice care.   FAMILY HISTORY: From old records, father died of prostate cancer, mother had hypothyroidism.   CURRENT HOME MEDICATIONS:  1.  Trazodone 50 mg, 1/2 tablet once a day.  2.  Torsemide 20 mg 2 tablets b.i.d.  3.  Spiriva 18 mcg inhalation daily.  4.  Omeprazole 40 mg b.i.d.  5.  Morphine 0.25 mL orally every 4 hours as needed.  6.  Levothyroxine 100 mcg p.o. daily. 7.  Klor-Con 10 mEq p.o. daily.  8.  Fluticasone nasal spray 50 mcg two sprays once a day.  9.  Aspirin 325 p.o. daily.  10.  Amiodarone 200 mg  daily.  11.  Alprazolam 0.25 mg orally every 8 hours as needed.  12.  Albuterol inhaler nebulizer 3 mL every 6 hours as needed.   ALLERGIES: TO PENICILLIN, LEVAQUIN, SULFA, AND FLONASE.    REVIEW OF SYSTEMS:  CONSTITUTIONAL: No fever, positive fatigue, weakness.  EYES: No blurred or double vision or glaucoma or cataract.  ENT: No tinnitus, ear pain, hearing loss, or postnasal drip.  RESPIRATORY: No cough, wheeze, or dyspnea.  CARDIOVASCULAR: No chest pain, orthopnea, edema. Hypertension.   GASTROINTESTINAL: No nausea, vomiting, diarrhea, abdominal pain.  GENITOURINARY: No dysuria, hematuria, or frequency.  ENDOCRINE: No polyuria, nocturia, or thyroid problems.  HEMATOLOGY: No anemia, easy bruising, or bleeding.  SKIN: No acne, rash, or lesion.  MUSCULOSKELETAL: Positive for arthritis, no swelling or gout.  NEUROLOGIC:  No CVA, TIA. Positive for some headache.  PSYCHIATRIC: No anxiety, depression, or bipolar disorder. All other systems reviewed and negative.   PHYSICAL EXAMINATION: GENERAL: The patient is awake, alert, oriented x 3, not in acute distress. VITAL SIGNS:  Afebrile. Pulse is 74, blood pressure 98/55, saturations are 94% on 2 liters nasal cannula oxygen.  HEENT: Atraumatic, normocephalic. PERRLA, EOM intact. Oral mucosa is moist. Patient does have a hematoma on the right parietal-temporal scalp area.  NECK: Supple. No JVD. No carotid bruit.  RESPIRATORY: Decreased breath sounds in the bases, bibasilar fine rales present. No respiratory distress or  labored breathing.  CARDIOVASCULAR: Both the heart sounds are normal. Rate and rhythm regular. PMI not lateralized. Chest nontender. Good pedal pulses. Good femoral pulses. There is 2+ pitting edema in both lower extremities.  SKIN: Warm and dry. The patient has a bruise and a skin tear on her right elbow. No active bleeding noted.  PSYCHIATRIC:  The patient is awake, alert, oriented x 3.   IMAGING: CT cervical spine shows diffuse  degenerative changes of the cervical spine,  minimal anterior subluxation of C4 on C5. No acute fracture. No evidence of dislocation. Carotid atherosclerotic vascular disease present. CT of the head shows tiny punctate petechial hemorrhage in the right lenticular nucleus and medial left upper lobe cannot be excluded. Followup CT head in 24 hours would be helpful. Prominent scalp hematoma right posterior parietal region. No underlying bony abnormality, probably tiny lacunar infarct in the left caudate nucleus.   LABORATORY DATA: CBC within normal limits except platelet count of 146,000 and  hemoglobin of 10.8. Creatinine of 2.93. Sodium is 134, BUN is 48, glucose is 94, potassium is 5.1, chloride is 96, calcium is 8.2.   ASSESSMENT: A 79 year old [name] [name] with history of severe cardiomyopathy, ejection fraction of around 15% with status post pacemaker and history of end-stage chronic obstructive pulmonary disease who is under hospice care and comes in with:  1.  Fall and right posterior parietal scalp hematoma with CT scan positive for intracranial tiny punctate hemorrhages. The patient is neurologically intact. She is alert, oriented x 3. Will admit her on medical floor. Will have physical therapy see patient for gait ambulation training. She is followed under hospice palliative care, who already saw patient in the Emergency Room. Social worker and care management for discharge planning.  2.  Severe cardiomyopathy with ejection fraction of 15%, status post pacemaker placement. Will continue her cardiac medications.  3.  Acute on chronic renal failure; baseline creatinine around 1.6-2.0. The patient came in with creatinine of 2.92. I will give her very little bit of fluid to hydrate her. Hold off on her torsemide for tonight.  4.  Hypothyroidism, continue her levothyroxine.  5.  Severe cardiomyopathy with end-stage chronic obstructive pulmonary disease.  Will continue her morphine, which she gets  through the hospice along with Spiriva and DuoNeb as needed.  6.  Deep vein thrombosis prophylaxis, TEDs and sequential compression devices. I will  avoid any antiplatelet agents given her subdural hematoma along with intracranial hemorrhages.  7.  Gastroesophageal reflux disease. Continue proton pump inhibitors.  8.  The patient is a no code, DO NOT RESUSCITATE. This was confirmed with patient's son who was present in the Emergency Room. Hospital admission was discussed with patient and patient's son who are agreeable to it.   TIME SPENT: Fifty minutes.   Case was also discussed with Dr. Harvie JuniorPhifer who agrees with above care.    ____________________________ Wylie HailSona A. Allena KatzPatel, MD sap:LT D: 01/13/2014 17:32:03 ET T: 01/13/2014 19:03:46 ET JOB#: [id number]  cc: Rether Rison A. Allena KatzPatel, MD, <Dictator> Willow OraSONA A Rillie Riffel MD ELECTRONICALLY SIGNED 01/16/2014 15:21

## 2014-08-16 NOTE — Discharge Summary (Signed)
PATIENT NAME:  [name] [name], [name] [name] MR#:  [id number] DATE OF BIRTH:  [birth year]  DATE OF ADMISSION:  01/13/2014  DATE OF DISCHARGE: 01/14/2014.   ADMISSION DIAGNOSES:  1.  Intracranial bleed.  2.  Acute renal failure on chronic kidney disease, stage IV.  3.  Acute on chronic systolic heart failure, ejection fraction of 10%.  4. End-stage chronic obstructive pulmonary disease.   CONSULTATIONS: Palliative care.   DISCHARGE LABORATORY DATA:  Sodium 130, potassium 5.9, chloride 93, bicarbonate 30, BUN 55, creatinine 3.89, glucose 86.   HOSPITAL COURSE: A 79 year old female presented with a fall, had a CT of the head which showed tiny punctuate petechial hemorrhage in the right ventricule.   For further details, please refer to the H and P.  1.  Intracranial bleed. CT scan showed a small punctuate petechial hemorrhage. The patient is on hospice care.  Family decided that the patient should stay in the hospital here for just conservative management as the patient is on hospice.  No further work-up at this time.  Repeat CT was not obtained as this would not change any management.  2.  Acute on chronic systolic heart failure, ejection fraction of 10%.  Initially the patient'[name] torsemide was held due to her renal function.  Due to her chest x-ray and increased respiratory rate and need for oxygen, Lasix was started as she was deemed to have acute on chronic systolic heart failure.  3.  Hypothyroidism. The patient continued on her outpatient medications. 4.  History of chronic obstructive pulmonary disease, endstage and this is stable.  5.  Chronic renal failure stage IV; baseline creatinine is about 1.6 to 2.  Creatinine has worsened due to the heart failure.  This is secondary to prerenal azotemia from her congestive heart failure.  6.  Urinary retention. The patient had greater than 700 mL, not voiding.  Foley was ordered as well as Flomax.   CURRENT MEDICATIONS:  1.  Synthroid 100 mcg daily.  2.   Amiodarone 200 mg daily.  3.  Morphine 0.25 mL every 4 hours p.r.n. shortness of breath.  4.  Xanax 0.25 mg every 8 hours p.r.n.  5.  Torsemide 20 mg 2 tablets b.i.d.  6.  Fluticasone 2 sprays daily.  7.  Omeprazole 40 mg b.i.d.  8.  Spiriva 18 mcg daily.  9.  Albuterol 3 mL every 6 hours p.r.n.  10. Trazodone 50 mg 1/2 tablet at bedtime p.r.n.  11. Tamsulosin 0.4 mg daily.   Discharge with a Foley, discharge with oxygen at 4 liters nasal cannula.  [facility] HEr AICD will be deactivated prior to discahrge.  DISCHARGE DIET: Regular. Ensure 3 times a day.   DISPOSITION:  Discharge to hospice,    TIME SPENT: Approximately 40 minutes.      ____________________________ Janyth ContesSital P. Juliene PinaMody, MD spm:DT D: 01/14/2014 12:50:00 ET T: 01/14/2014 13:03:58 ET JOB#: [id number]  cc: [name] P. Juliene PinaMody, MD, <Dictator> Stann Mainlandavid P. Sampson GoonFitzgerald, MD Patricia PesaSITAL P [name] MD ELECTRONICALLY SIGNED 01/14/2014 21:29

## 2015-04-10 IMAGING — RF DG BARIUM SWALLOW
5 series · 15 of 20 positions shown · non-contrast
Comparison: None.

CLINICAL DATA: Dysphagia

EXAM:
ESOPHOGRAM/BARIUM SWALLOW
TECHNIQUE: Combined double contrast and single contrast examination performed
using effervescent crystals, thick barium liquid, and thin barium
liquid.
FLUOROSCOPY TIME:  12 seconds

[Series 1: fluoro_barium 2fps_bw · 0.17mm/px · 3 of 6 frames shown (1 of 4)]
[frame 1/6]
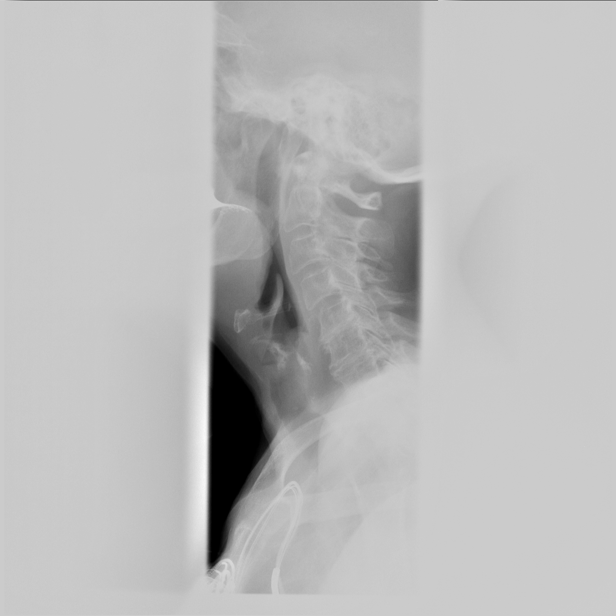
[frame 4/6]
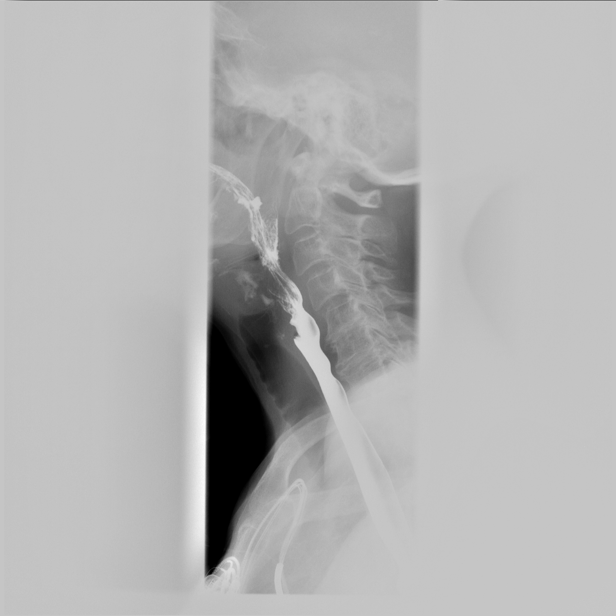
[frame 6/6]
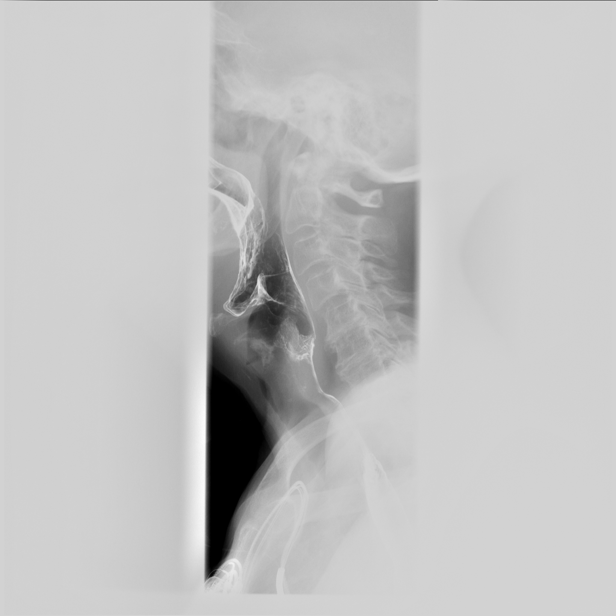

[Series 2: fluoro_barium 2fps_bw · 0.17mm/px · 3 of 15 frames shown (2 of 4)]
[frame 3/15]
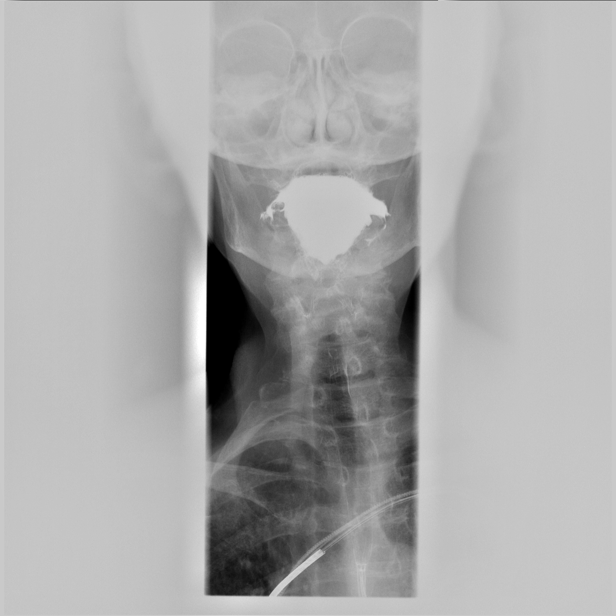
[frame 8/15]
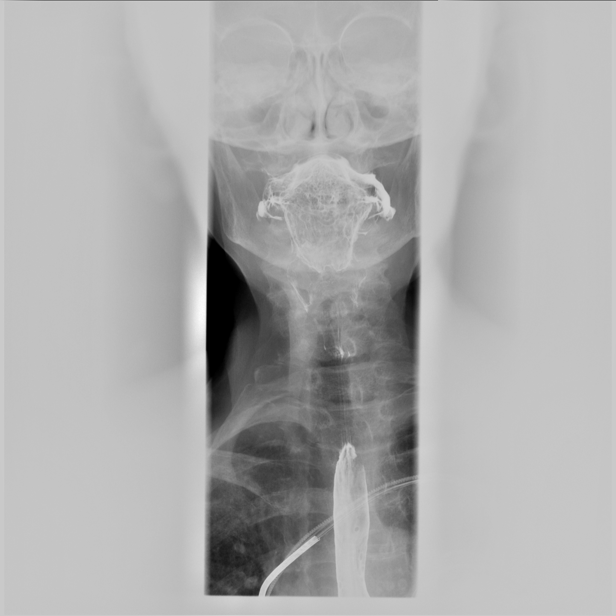
[frame 13/15]
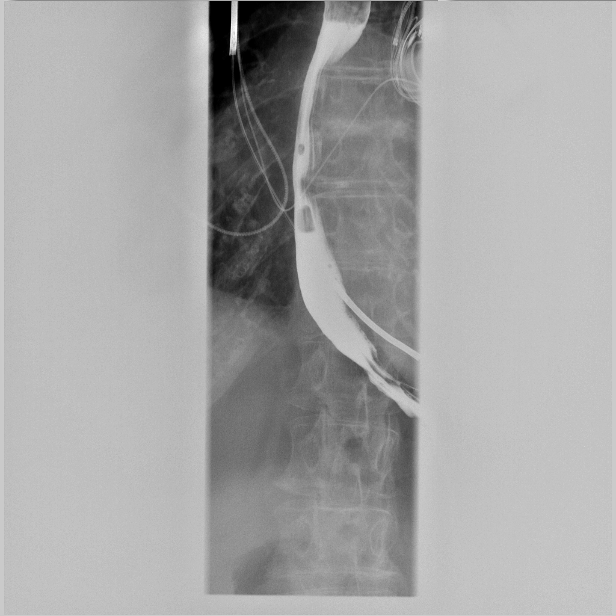

[Series 3: cp_standard · 0.25mm/px · 3 of 4 slices shown]
[im 1/4]
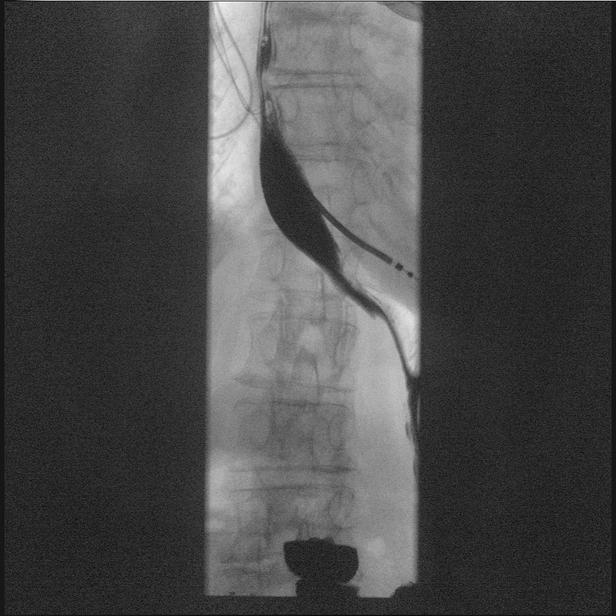
[im 3/4]
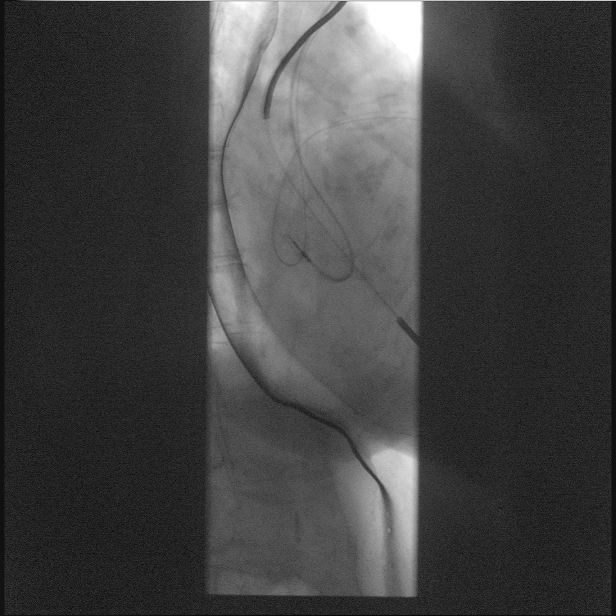
[im 4/4]
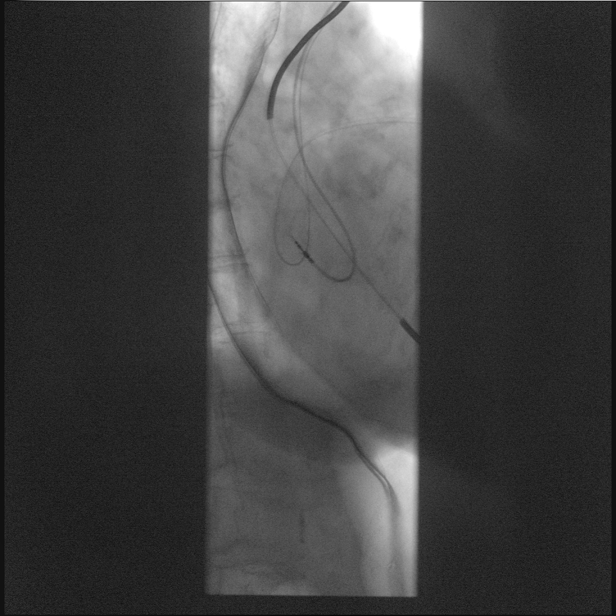

[Series 4: fluoro_barium 2fps_bw · 0.17mm/px · 3 of 20 frames shown (3 of 4)]
[frame 4/20]
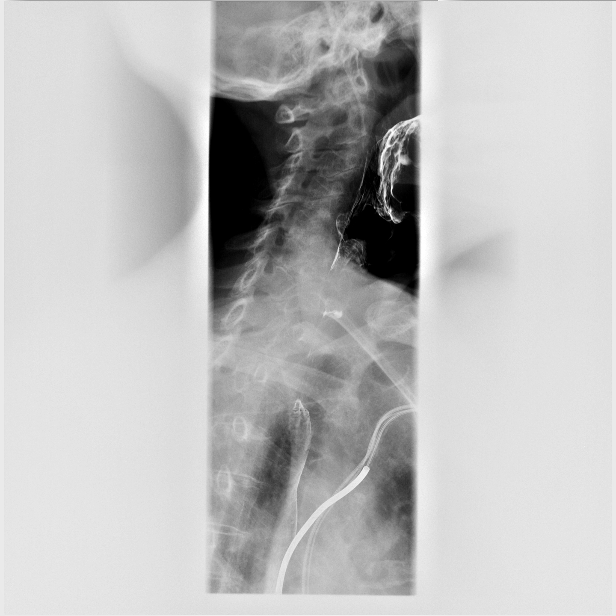
[frame 11/20]
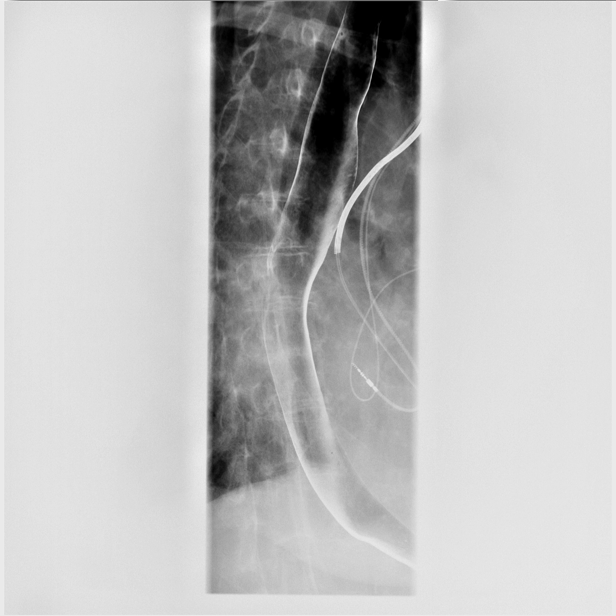
[frame 18/20]
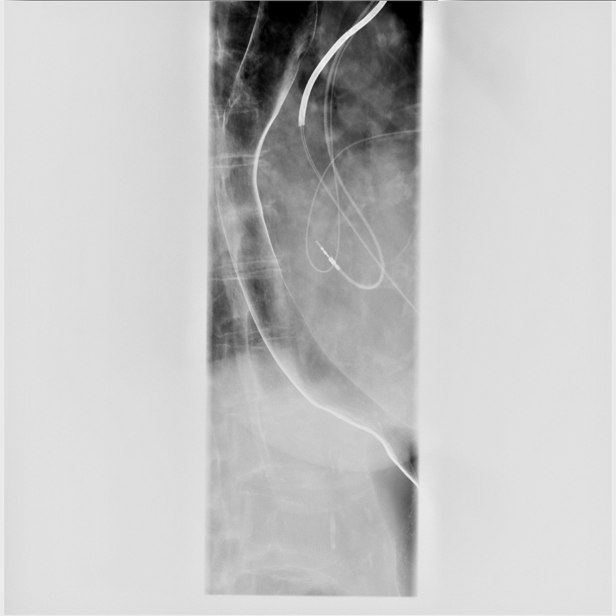

[Series 8: fluoro_barium 2fps_bw · 0.17mm/px · 3 of 14 frames shown (4 of 4)]
[frame 3/14]
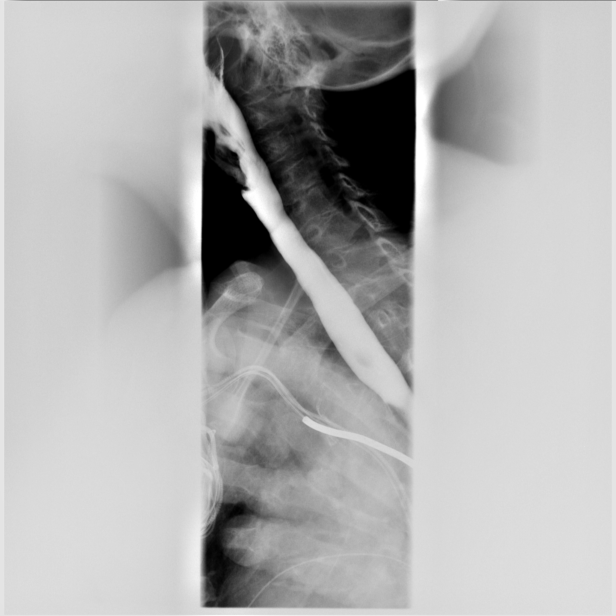
[frame 8/14]
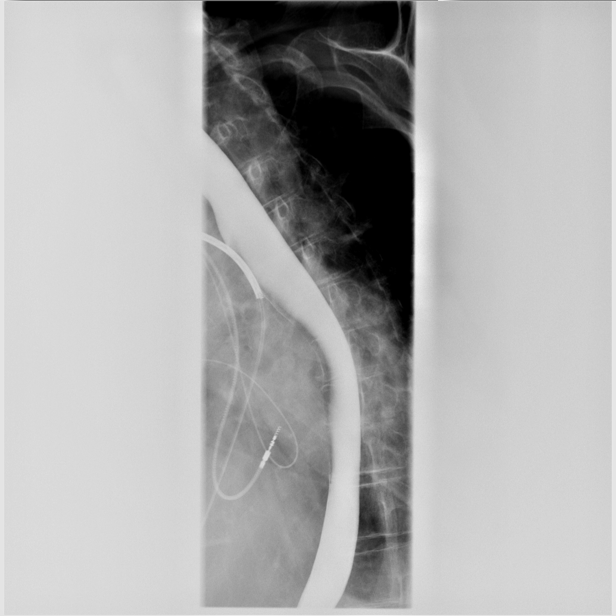
[frame 12/14]
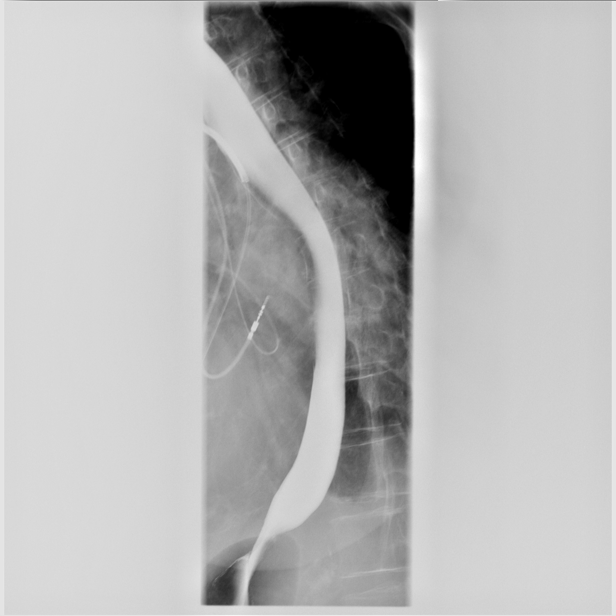

[15 of 20 positions shown; findings below may reference images not displayed]

FINDINGS: There was normal pharyngeal anatomy and motility. Contrast flowed
freely through the esophagus without evidence of stricture or mass.
There was normal esophageal mucosa without evidence of irregularity
or ulceration. Esophageal motility was normal. No evidence of
reflux. No definite hiatal hernia was demonstrated.
IMPRESSION: Normal barium swallow.
